# Patient Record
Sex: Female | Born: 1970 | Race: White | Hispanic: No | Marital: Married | Smoking: Former smoker
Health system: Southern US, Community
[De-identification: ages and names within clinical notes are randomized; demographics above are authoritative.]

## PROBLEM LIST (undated history)

## (undated) DIAGNOSIS — K219 Gastro-esophageal reflux disease without esophagitis: Secondary | ICD-10-CM

## (undated) DIAGNOSIS — Z9889 Other specified postprocedural states: Secondary | ICD-10-CM

## (undated) DIAGNOSIS — R112 Nausea with vomiting, unspecified: Secondary | ICD-10-CM

## (undated) DIAGNOSIS — E039 Hypothyroidism, unspecified: Secondary | ICD-10-CM

## (undated) HISTORY — PX: OTHER SURGICAL HISTORY: SHX169

## (undated) HISTORY — PX: LAPAROTOMY: SHX154

## (undated) HISTORY — PX: BREAST LUMPECTOMY: SHX2

## (undated) HISTORY — PX: TONSILLECTOMY: SUR1361

---

## 2000-04-18 ENCOUNTER — Encounter: Payer: Self-pay | Admitting: Emergency Medicine

## 2000-04-18 ENCOUNTER — Emergency Department (HOSPITAL_COMMUNITY): Admission: EM | Admit: 2000-04-18 | Discharge: 2000-04-18 | Payer: Self-pay | Admitting: *Deleted

## 2009-05-14 ENCOUNTER — Emergency Department (HOSPITAL_COMMUNITY): Admission: EM | Admit: 2009-05-14 | Discharge: 2009-05-14 | Payer: Self-pay | Admitting: Emergency Medicine

## 2011-07-21 ENCOUNTER — Other Ambulatory Visit (HOSPITAL_COMMUNITY)
Admission: RE | Admit: 2011-07-21 | Discharge: 2011-07-21 | Disposition: A | Discharge status: Home / Self Care | Payer: BC Managed Care – PPO | Source: Ambulatory Visit | Attending: Obstetrics and Gynecology | Admitting: Obstetrics and Gynecology

## 2011-07-21 DIAGNOSIS — Z124 Encounter for screening for malignant neoplasm of cervix: Secondary | ICD-10-CM | POA: Insufficient documentation

## 2011-07-21 DIAGNOSIS — Z1151 Encounter for screening for human papillomavirus (HPV): Secondary | ICD-10-CM | POA: Insufficient documentation

## 2015-10-17 ENCOUNTER — Other Ambulatory Visit: Payer: Self-pay | Admitting: Obstetrics and Gynecology

## 2015-10-17 DIAGNOSIS — R928 Other abnormal and inconclusive findings on diagnostic imaging of breast: Secondary | ICD-10-CM

## 2015-10-24 ENCOUNTER — Ambulatory Visit
Admission: RE | Admit: 2015-10-24 | Discharge: 2015-10-24 | Disposition: A | Discharge status: Home / Self Care | Payer: Managed Care, Other (non HMO) | Source: Ambulatory Visit | Attending: Obstetrics and Gynecology | Admitting: Obstetrics and Gynecology

## 2015-10-24 DIAGNOSIS — R928 Other abnormal and inconclusive findings on diagnostic imaging of breast: Secondary | ICD-10-CM

## 2016-10-29 ENCOUNTER — Other Ambulatory Visit (HOSPITAL_COMMUNITY): Payer: Self-pay | Admitting: Obstetrics & Gynecology

## 2016-10-29 DIAGNOSIS — E010 Iodine-deficiency related diffuse (endemic) goiter: Secondary | ICD-10-CM

## 2016-10-31 ENCOUNTER — Ambulatory Visit (HOSPITAL_COMMUNITY)
Admission: RE | Admit: 2016-10-31 | Discharge: 2016-10-31 | Disposition: A | Discharge status: Home / Self Care | Payer: BLUE CROSS/BLUE SHIELD | Source: Ambulatory Visit | Attending: Obstetrics & Gynecology | Admitting: Obstetrics & Gynecology

## 2016-10-31 ENCOUNTER — Ambulatory Visit (HOSPITAL_COMMUNITY): Payer: BLUE CROSS/BLUE SHIELD

## 2016-10-31 DIAGNOSIS — E010 Iodine-deficiency related diffuse (endemic) goiter: Secondary | ICD-10-CM | POA: Diagnosis not present

## 2016-11-06 ENCOUNTER — Other Ambulatory Visit: Payer: Self-pay | Admitting: Endocrinology

## 2016-11-06 DIAGNOSIS — E049 Nontoxic goiter, unspecified: Secondary | ICD-10-CM

## 2016-11-07 ENCOUNTER — Other Ambulatory Visit: Payer: Self-pay | Admitting: Endocrinology

## 2016-11-07 DIAGNOSIS — E049 Nontoxic goiter, unspecified: Secondary | ICD-10-CM

## 2016-11-11 ENCOUNTER — Ambulatory Visit
Admission: RE | Admit: 2016-11-11 | Discharge: 2016-11-11 | Disposition: A | Discharge status: Home / Self Care | Payer: BLUE CROSS/BLUE SHIELD | Source: Ambulatory Visit | Attending: Endocrinology | Admitting: Endocrinology

## 2016-11-11 ENCOUNTER — Other Ambulatory Visit (HOSPITAL_COMMUNITY)
Admission: RE | Admit: 2016-11-11 | Discharge: 2016-11-11 | Disposition: A | Discharge status: Home / Self Care | Payer: BLUE CROSS/BLUE SHIELD | Source: Ambulatory Visit | Attending: Radiology | Admitting: Radiology

## 2016-11-11 DIAGNOSIS — E049 Nontoxic goiter, unspecified: Secondary | ICD-10-CM

## 2016-11-24 ENCOUNTER — Encounter (HOSPITAL_COMMUNITY): Payer: Self-pay

## 2017-01-08 NOTE — Pre-Procedure Instructions (Signed)
Sydney Stephens  01/08/2017      [id number] - [place], [place] - [address] [place] [address] Phone: [phone] Fax: [phone]    Your procedure is scheduled on January 16, 2017.  Report to [facility] Admitting at [phone] A.M.  Call this number if you have problems the morning of surgery:[phone]   Remember:  Do not eat food or drink liquids after midnight.  Take these medicines the morning of surgery with A SIP OF WATER levothyroxine (synthroid), loratadine (claritin), ranitidine (zantac), tetrahydrozoline-zinc (Visine-AC).  7 days prior to surgery STOP taking any Aspirin, Aleve, Naproxen, Ibuprofen, Motrin, Advil, Goody's, BC's, all herbal medications, fish oil, and all vitamins   Do not wear jewelry, make-up or nail polish.  Do not wear lotions, powders, or perfumes, or deoderant.  Do not shave 48 hours prior to surgery.    Do not bring valuables to the hospital.  [facility] is not responsible for any belongings or valuables.  Contacts, dentures or bridgework may not be worn into surgery.  Leave your suitcase in the car.  After surgery it may be brought to your room.  For patients admitted to the hospital, discharge time will be determined by your treatment team.  Patients discharged the day of surgery will not be allowed to drive home.    Special instructions:   [facility]- Preparing For Surgery  Before surgery, you can play an important role. Because skin is not sterile, your skin needs to be as free of germs as possible. You can reduce the number of germs on your skin by washing with CHG (chlorahexidine gluconate) Soap before surgery.  CHG is an antiseptic cleaner which kills germs and bonds with the skin to continue killing germs even after washing.  Please do not use if you have an allergy to CHG or antibacterial soaps. If your skin becomes reddened/irritated stop using the CHG.  Do not shave (including legs and  underarms) for at least 48 hours prior to first CHG shower. It is OK to shave your face.  Please follow these instructions carefully.   1. Shower the NIGHT BEFORE SURGERY and the MORNING OF SURGERY with CHG.   2. If you chose to wash your hair, wash your hair first as usual with your normal shampoo.  3. After you shampoo, rinse your hair and body thoroughly to remove the shampoo.  4. Use CHG as you would any other liquid soap. You can apply CHG directly to the skin and wash gently with a scrungie or a clean washcloth.   5. Apply the CHG Soap to your body ONLY FROM THE NECK DOWN.  Do not use on open wounds or open sores. Avoid contact with your eyes, ears, mouth and genitals (private parts). Wash genitals (private parts) with your normal soap.  6. Wash thoroughly, paying special attention to the area where your surgery will be performed.  7. Thoroughly rinse your body with warm water from the neck down.  8. DO NOT shower/wash with your normal soap after using and rinsing off the CHG Soap.  9. Pat yourself dry with a CLEAN TOWEL.   10. Wear CLEAN PAJAMAS   11. Place CLEAN SHEETS on your bed the night of your first shower and DO NOT SLEEP WITH PETS.    Day of Surgery: Do not apply any deodorants/lotions. Please wear clean clothes to the hospital/surgery center.     Please read over the following fact sheets  that you were given. Pain Booklet, Coughing and Deep Breathing and Surgical Site Infection Prevention

## 2017-01-08 NOTE — H&P (Signed)
HPI:   Sydney Stephens is a 46 y.o. female who presents as a consult Patient.   Referring Provider: Valli Glance*  Chief complaint: Thyroid nodules.  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 She is also hypothyroid. She is on replacement therapy. She had an ultrasound and biopsy recently. Molecular testing revealed a suspicious nodule, one on each side. The largest one was just under 2 cm. She has had some trouble swallowing especially pills. She has long-standing reflux and takes H2 blocker therapy. She quit smoking years ago. She drinks 2 cups of coffee each day.  PMH/Meds/All/SocHx/FamHx/ROS:   Past Medical History:  Diagnosis Date  . Hypothyroid   Past Surgical History:  Procedure Laterality Date  . BREAST SURGERY  . LAPAROTOMY  . MOUTH SURGERY  . TONSILLECTOMY   No family history of bleeding disorders, wound healing problems or difficulty with anesthesia.   Social History   Social History  . Marital status: Married  Spouse name: N/A  . Number of children: N/A  . Years of education: N/A   Occupational History  . Not on file.   Social History Main Topics  . Smoking status: Never Smoker  . Smokeless tobacco: Never Used  . Alcohol use Not on file  . Drug use: Unknown  . Sexual activity: Not on file   Other Topics Concern  . Not on file   Social History Narrative  . No narrative on file   Current Outpatient Prescriptions:  . aspirin 81 MG chewable tablet *ANTIPLATELET*, Take 81 mg by mouth daily., Disp: , Rfl:  . diphenhydramine HCl (BENADRYL ORAL), Take by mouth., Disp: , Rfl:  . famotidine (PEPCID ORAL), Take by mouth., Disp: , Rfl:  . levothyroxine (SYNTHROID, LEVOTHROID) 75 MCG tablet, , Disp: , Rfl:  . loratadine (CLARITIN ORAL), Take by mouth., Disp: , Rfl:   A complete ROS was performed with pertinent positives/negatives noted in the HPI. The remainder of the ROS are negative.   Physical Exam:   BP 111/75 (Site: Left arm,  Position: Standing)  Ht 1.778 m (5\' 10" )  Wt 83.5 kg (184 lb)  BMI 26.40 kg/m   General: Healthy and alert, in no distress, breathing easily. Normal affect. In a pleasant mood. Head: Normocephalic, atraumatic. No masses, or scars. Eyes: Pupils are equal, and reactive to light. Vision is grossly intact. No spontaneous or gaze nystagmus. Ears: Ear canals are clear. Tympanic membranes are intact, with normal landmarks and the middle ears are clear and healthy. Hearing: Grossly normal. Nose: Nasal cavities are clear with healthy mucosa, no polyps or exudate.Airways are patent. Face: No masses or scars, facial nerve function is symmetric. Oral Cavity: No mucosal abnormalities are noted. Tongue with normal mobility. Dentition appears healthy. Oropharynx: Tonsils are symmetric. There are no mucosal masses identified. Tongue base appears normal and healthy. Larynx/Hypopharynx: indirect exam reveals healthy, mobile vocal cords, without mucosal lesions in the hypopharynx or larynx. Chest: Deferred Neck: No palpable masses, no cervical adenopathy, bilateral thyroid enlargement. Neuro: Cranial nerves II-XII will normal function. Balance: Normal gate. Other findings: none.  Independent Review of Additional Tests or Records:  none  Procedures:  none  Impression & Plans:  Bilateral thyroid nodules, suspicious cytology on molecular testing. We discussed there is approximately 40% chance of thyroid cancer. Recommend total thyroidectomy for diagnostic and possibly therapeutic purposes. Trouble swallowing is likely not related to the thyroid and probably from reflux.We discussed causes of reflux, including lifestyle and dietary factors. Recommend strict avoidance of all tobacco,  caffeine, alcohol, chocolate and peppermint. A reflux handout with more detailed instructions was provided to the patient.  Risks and benefits of thyroid surgery were discussed in detail. A handout was provided. All questions were  answered. We will schedule at her convenience.

## 2017-01-09 ENCOUNTER — Encounter (HOSPITAL_COMMUNITY): Payer: Self-pay | Admitting: *Deleted

## 2017-01-09 ENCOUNTER — Encounter (HOSPITAL_COMMUNITY)
Admission: RE | Admit: 2017-01-09 | Discharge: 2017-01-09 | Disposition: A | Discharge status: Home / Self Care | Payer: BLUE CROSS/BLUE SHIELD | Source: Ambulatory Visit | Attending: Otolaryngology | Admitting: Otolaryngology

## 2017-01-09 ENCOUNTER — Ambulatory Visit (HOSPITAL_COMMUNITY)
Admission: RE | Admit: 2017-01-09 | Discharge: 2017-01-09 | Disposition: A | Discharge status: Home / Self Care | Payer: BLUE CROSS/BLUE SHIELD | Source: Ambulatory Visit | Attending: Anesthesiology | Admitting: Anesthesiology

## 2017-01-09 DIAGNOSIS — Z01810 Encounter for preprocedural cardiovascular examination: Secondary | ICD-10-CM | POA: Insufficient documentation

## 2017-01-09 DIAGNOSIS — Z01818 Encounter for other preprocedural examination: Secondary | ICD-10-CM | POA: Diagnosis present

## 2017-01-09 DIAGNOSIS — Z01811 Encounter for preprocedural respiratory examination: Secondary | ICD-10-CM

## 2017-01-09 HISTORY — DX: Gastro-esophageal reflux disease without esophagitis: K21.9

## 2017-01-09 HISTORY — DX: Hypothyroidism, unspecified: E03.9

## 2017-01-09 HISTORY — DX: Nausea with vomiting, unspecified: R11.2

## 2017-01-09 HISTORY — DX: Other specified postprocedural states: Z98.890

## 2017-01-09 LAB — BASIC METABOLIC PANEL
Anion gap: 8 (ref 5–15)
BUN: 24 mg/dL — AB (ref 6–20)
CALCIUM: 9.6 mg/dL (ref 8.9–10.3)
CO2: 26 mmol/L (ref 22–32)
Chloride: 107 mmol/L (ref 101–111)
Creatinine, Ser: 0.98 mg/dL (ref 0.44–1.00)
GFR calc Af Amer: >60 mL/min (ref >60)
Glucose, Bld: 98 mg/dL (ref 65–99)
Potassium: 5.1 mmol/L (ref 3.5–5.1)
Sodium: 141 mmol/L (ref 135–145)

## 2017-01-09 LAB — CBC
HEMATOCRIT: 40.7 % (ref 36.0–46.0)
HEMOGLOBIN: 13 g/dL (ref 12.0–15.0)
MCH: 30.7 pg (ref 26.0–34.0)
MCHC: 31.9 g/dL (ref 30.0–36.0)
MCV: 96.2 fL (ref 78.0–100.0)
Platelets: 300 10*3/uL (ref 150–400)
RBC: 4.23 MIL/uL (ref 3.87–5.11)
RDW: 13.2 % (ref 11.5–15.5)
WBC: 7.3 10*3/uL (ref 4.0–10.5)

## 2017-01-09 NOTE — Progress Notes (Signed)
PCP is Dr Linda Hedges  Denies ever seeing a cardiologist.  Denies ever having a stress test, echo, or card cath. Denies ever having an EKG. Denies  fever, cough, chest pain, or shortness of breath.

## 2017-01-16 ENCOUNTER — Observation Stay (HOSPITAL_COMMUNITY)
Admission: RE | Admit: 2017-01-16 | Discharge: 2017-01-17 | Disposition: A | Discharge status: Home / Self Care | Payer: BLUE CROSS/BLUE SHIELD | Source: Ambulatory Visit | Attending: Otolaryngology | Admitting: Otolaryngology

## 2017-01-16 ENCOUNTER — Encounter (HOSPITAL_COMMUNITY)
Admission: RE | Disposition: A | Discharge status: Home / Self Care | Payer: Self-pay | Source: Ambulatory Visit | Attending: Otolaryngology

## 2017-01-16 ENCOUNTER — Ambulatory Visit (HOSPITAL_COMMUNITY): Payer: BLUE CROSS/BLUE SHIELD | Admitting: Anesthesiology

## 2017-01-16 ENCOUNTER — Encounter (HOSPITAL_COMMUNITY): Payer: Self-pay | Admitting: Urology

## 2017-01-16 DIAGNOSIS — E063 Autoimmune thyroiditis: Secondary | ICD-10-CM | POA: Insufficient documentation

## 2017-01-16 DIAGNOSIS — Z7982 Long term (current) use of aspirin: Secondary | ICD-10-CM | POA: Diagnosis not present

## 2017-01-16 DIAGNOSIS — Z87891 Personal history of nicotine dependence: Secondary | ICD-10-CM | POA: Insufficient documentation

## 2017-01-16 DIAGNOSIS — E039 Hypothyroidism, unspecified: Secondary | ICD-10-CM | POA: Insufficient documentation

## 2017-01-16 DIAGNOSIS — E042 Nontoxic multinodular goiter: Secondary | ICD-10-CM | POA: Diagnosis present

## 2017-01-16 DIAGNOSIS — D34 Benign neoplasm of thyroid gland: Principal | ICD-10-CM | POA: Insufficient documentation

## 2017-01-16 DIAGNOSIS — E890 Postprocedural hypothyroidism: Secondary | ICD-10-CM

## 2017-01-16 DIAGNOSIS — K219 Gastro-esophageal reflux disease without esophagitis: Secondary | ICD-10-CM | POA: Insufficient documentation

## 2017-01-16 DIAGNOSIS — Z79899 Other long term (current) drug therapy: Secondary | ICD-10-CM | POA: Diagnosis not present

## 2017-01-16 DIAGNOSIS — Z9889 Other specified postprocedural states: Secondary | ICD-10-CM

## 2017-01-16 HISTORY — PX: THYROIDECTOMY: SHX17

## 2017-01-16 LAB — CALCIUM: Calcium: 8.7 mg/dL — ABNORMAL LOW (ref 8.9–10.3)

## 2017-01-16 LAB — PREGNANCY, URINE: PREG TEST UR: NEGATIVE

## 2017-01-16 SURGERY — THYROIDECTOMY
Anesthesia: General

## 2017-01-16 MED ORDER — 0.9 % SODIUM CHLORIDE (POUR BTL) OPTIME
TOPICAL | Status: DC | PRN
  Administered 2017-01-16: 1000 mL

## 2017-01-16 MED ORDER — OXYCODONE HCL 5 MG PO TABS: 5.0000 mg | ORAL_TABLET | Freq: Once | ORAL | Status: DC | PRN

## 2017-01-16 MED ORDER — MIDAZOLAM HCL 2 MG/2ML IJ SOLN
INTRAMUSCULAR | Status: AC
  Filled 2017-01-16: qty 2

## 2017-01-16 MED ORDER — IBUPROFEN 100 MG/5ML PO SUSP
400.0000 mg | Freq: Four times a day (QID) | ORAL | Status: DC | PRN
  Filled 2017-01-16: qty 20

## 2017-01-16 MED ORDER — DEXAMETHASONE SODIUM PHOSPHATE 10 MG/ML IJ SOLN
INTRAMUSCULAR | Status: DC | PRN
  Administered 2017-01-16: 10 mg via INTRAVENOUS

## 2017-01-16 MED ORDER — PROPOFOL 10 MG/ML IV BOLUS
INTRAVENOUS | Status: AC
  Filled 2017-01-16: qty 40

## 2017-01-16 MED ORDER — DEXAMETHASONE SODIUM PHOSPHATE 10 MG/ML IJ SOLN
INTRAMUSCULAR | Status: AC
  Filled 2017-01-16: qty 3

## 2017-01-16 MED ORDER — SUCCINYLCHOLINE CHLORIDE 200 MG/10ML IV SOSY
PREFILLED_SYRINGE | INTRAVENOUS | Status: AC
  Filled 2017-01-16: qty 10

## 2017-01-16 MED ORDER — DEXTROSE-NACL 5-0.9 % IV SOLN
INTRAVENOUS | Status: DC
  Administered 2017-01-16 – 2017-01-17 (×2): via INTRAVENOUS

## 2017-01-16 MED ORDER — DIPHENHYDRAMINE HCL 25 MG PO TABS
25.0000 mg | ORAL_TABLET | Freq: Every day | ORAL | Status: DC
  Filled 2017-01-16: qty 1

## 2017-01-16 MED ORDER — SUGAMMADEX SODIUM 200 MG/2ML IV SOLN
INTRAVENOUS | Status: AC
  Filled 2017-01-16: qty 2

## 2017-01-16 MED ORDER — FAMOTIDINE 10 MG PO TABS
10.0000 mg | ORAL_TABLET | Freq: Two times a day (BID) | ORAL | Status: DC
  Administered 2017-01-16 – 2017-01-17 (×2): 10 mg via ORAL
  Filled 2017-01-16 (×2): qty 1

## 2017-01-16 MED ORDER — NAPHAZOLINE-GLYCERIN 0.012-0.2 % OP SOLN
1.0000 [drp] | Freq: Every day | OPHTHALMIC | Status: DC
  Administered 2017-01-17: 1 [drp] via OPHTHALMIC
  Filled 2017-01-16: qty 15

## 2017-01-16 MED ORDER — FENTANYL CITRATE (PF) 250 MCG/5ML IJ SOLN
INTRAMUSCULAR | Status: AC
  Filled 2017-01-16: qty 5

## 2017-01-16 MED ORDER — MAGNESIUM OXIDE -MG SUPPLEMENT 500 MG PO CAPS: 1000.0000 mg | ORAL_CAPSULE | Freq: Every day | ORAL | Status: DC

## 2017-01-16 MED ORDER — HYDROCODONE-ACETAMINOPHEN 7.5-325 MG PO TABS: 1.0000 | ORAL_TABLET | Freq: Four times a day (QID) | ORAL | 0 refills | Status: DC | PRN

## 2017-01-16 MED ORDER — PHENYLEPHRINE 40 MCG/ML (10ML) SYRINGE FOR IV PUSH (FOR BLOOD PRESSURE SUPPORT)
PREFILLED_SYRINGE | INTRAVENOUS | Status: AC
  Filled 2017-01-16: qty 10

## 2017-01-16 MED ORDER — LIDOCAINE 2% (20 MG/ML) 5 ML SYRINGE
INTRAMUSCULAR | Status: AC
  Filled 2017-01-16: qty 15

## 2017-01-16 MED ORDER — PROMETHAZINE HCL 25 MG RE SUPP: 25.0000 mg | Freq: Four times a day (QID) | RECTAL | 1 refills | Status: DC | PRN

## 2017-01-16 MED ORDER — SUGAMMADEX SODIUM 200 MG/2ML IV SOLN
INTRAVENOUS | Status: DC | PRN
  Administered 2017-01-16: 200 mg via INTRAVENOUS

## 2017-01-16 MED ORDER — HYDROMORPHONE HCL 1 MG/ML IJ SOLN
0.2500 mg | INTRAMUSCULAR | Status: DC | PRN
  Administered 2017-01-16: 0.25 mg via INTRAVENOUS
  Administered 2017-01-16: 0.5 mg via INTRAVENOUS
  Administered 2017-01-16: 0.25 mg via INTRAVENOUS

## 2017-01-16 MED ORDER — PROMETHAZINE HCL 25 MG/ML IJ SOLN: 6.2500 mg | INTRAMUSCULAR | Status: DC | PRN

## 2017-01-16 MED ORDER — ROCURONIUM BROMIDE 10 MG/ML (PF) SYRINGE
PREFILLED_SYRINGE | INTRAVENOUS | Status: AC
  Filled 2017-01-16: qty 10

## 2017-01-16 MED ORDER — PROMETHAZINE HCL 25 MG RE SUPP
25.0000 mg | Freq: Four times a day (QID) | RECTAL | Status: DC | PRN
Start: 2017-01-16 — End: 2017-01-17
  Administered 2017-01-16 – 2017-01-17 (×2): 25 mg via RECTAL
  Filled 2017-01-16 (×2): qty 1

## 2017-01-16 MED ORDER — LEVOTHYROXINE SODIUM 75 MCG PO TABS
75.0000 ug | ORAL_TABLET | Freq: Every day | ORAL | Status: DC
  Administered 2017-01-17: 75 ug via ORAL
  Filled 2017-01-16: qty 1

## 2017-01-16 MED ORDER — MIDAZOLAM HCL 5 MG/5ML IJ SOLN
INTRAMUSCULAR | Status: DC | PRN
  Administered 2017-01-16: 2 mg via INTRAVENOUS

## 2017-01-16 MED ORDER — ROCURONIUM BROMIDE 100 MG/10ML IV SOLN
INTRAVENOUS | Status: DC | PRN
  Administered 2017-01-16: 40 mg via INTRAVENOUS

## 2017-01-16 MED ORDER — ONDANSETRON HCL 4 MG/2ML IJ SOLN
INTRAMUSCULAR | Status: DC | PRN
  Administered 2017-01-16: 4 mg via INTRAVENOUS

## 2017-01-16 MED ORDER — CALCIUM CARBONATE-VITAMIN D 500-200 MG-UNIT PO TABS
2.0000 | ORAL_TABLET | Freq: Every day | ORAL | Status: DC
  Administered 2017-01-17: 09:00:00 2 via ORAL
  Filled 2017-01-16: qty 2

## 2017-01-16 MED ORDER — ONDANSETRON HCL 4 MG/2ML IJ SOLN
INTRAMUSCULAR | Status: AC
  Filled 2017-01-16: qty 6

## 2017-01-16 MED ORDER — HYDROMORPHONE HCL 1 MG/ML IJ SOLN
INTRAMUSCULAR | Status: AC
  Filled 2017-01-16: qty 0.5

## 2017-01-16 MED ORDER — VANCOMYCIN HCL 1000 MG IV SOLR: INTRAVENOUS | Status: DC | PRN

## 2017-01-16 MED ORDER — LORATADINE 10 MG PO TABS
10.0000 mg | ORAL_TABLET | Freq: Every day | ORAL | Status: DC
  Administered 2017-01-17: 10 mg via ORAL
  Filled 2017-01-16: qty 1

## 2017-01-16 MED ORDER — PROPOFOL 10 MG/ML IV BOLUS
INTRAVENOUS | Status: DC | PRN
  Administered 2017-01-16: 200 mg via INTRAVENOUS

## 2017-01-16 MED ORDER — FENTANYL CITRATE (PF) 100 MCG/2ML IJ SOLN
INTRAMUSCULAR | Status: DC | PRN
Start: 2017-01-16 — End: 2017-01-16
  Administered 2017-01-16 (×3): 50 ug via INTRAVENOUS
  Administered 2017-01-16: 150 ug via INTRAVENOUS
  Administered 2017-01-16: 50 ug via INTRAVENOUS

## 2017-01-16 MED ORDER — FENTANYL CITRATE (PF) 250 MCG/5ML IJ SOLN
INTRAMUSCULAR | Status: AC
Start: 2017-01-16 — End: 2017-01-16
  Filled 2017-01-16: qty 5

## 2017-01-16 MED ORDER — LIDOCAINE HCL (CARDIAC) 20 MG/ML IV SOLN
INTRAVENOUS | Status: DC | PRN
  Administered 2017-01-16: 60 mg via INTRAVENOUS

## 2017-01-16 MED ORDER — OXYCODONE HCL 5 MG/5ML PO SOLN: 5.0000 mg | Freq: Once | ORAL | Status: DC | PRN

## 2017-01-16 MED ORDER — HYDROCODONE-ACETAMINOPHEN 5-325 MG PO TABS
1.0000 | ORAL_TABLET | ORAL | Status: DC | PRN
  Administered 2017-01-16 – 2017-01-17 (×4): 2 via ORAL
  Filled 2017-01-16 (×4): qty 2

## 2017-01-16 MED ORDER — LIDOCAINE-EPINEPHRINE 1 %-1:100000 IJ SOLN
INTRAMUSCULAR | Status: AC
  Filled 2017-01-16: qty 1

## 2017-01-16 MED ORDER — HYDROMORPHONE HCL 1 MG/ML IJ SOLN
INTRAMUSCULAR | Status: AC
  Administered 2017-01-16: 0.25 mg via INTRAVENOUS
  Filled 2017-01-16: qty 0.5

## 2017-01-16 MED ORDER — DIPHENHYDRAMINE HCL 25 MG PO CAPS
25.0000 mg | ORAL_CAPSULE | Freq: Every day | ORAL | Status: DC
  Administered 2017-01-16: 25 mg via ORAL
  Filled 2017-01-16: qty 1

## 2017-01-16 MED ORDER — LACTATED RINGERS IV SOLN
INTRAVENOUS | Status: DC | PRN
  Administered 2017-01-16 (×2): via INTRAVENOUS

## 2017-01-16 SURGICAL SUPPLY — 43 items
BLADE SURG 15 STRL LF DISP TIS (BLADE) IMPLANT
BLADE SURG 15 STRL SS (BLADE)
CANISTER SUCT 3000ML PPV (MISCELLANEOUS) ×3 IMPLANT
CLEANER TIP ELECTROSURG 2X2 (MISCELLANEOUS) ×3 IMPLANT
CONT SPEC 4OZ CLIKSEAL STRL BL (MISCELLANEOUS) ×6 IMPLANT
CORDS BIPOLAR (ELECTRODE) ×6 IMPLANT
COVER SURGICAL LIGHT HANDLE (MISCELLANEOUS) ×3 IMPLANT
DERMABOND ADVANCED (GAUZE/BANDAGES/DRESSINGS) ×2
DERMABOND ADVANCED .7 DNX12 (GAUZE/BANDAGES/DRESSINGS) ×1 IMPLANT
DRAIN HEMOVAC 7FR (DRAIN) IMPLANT
DRAIN SNY 10 ROU (WOUND CARE) ×3 IMPLANT
DRAPE HALF SHEET 40X57 (DRAPES) IMPLANT
ELECT COATED BLADE 2.86 ST (ELECTRODE) ×3 IMPLANT
ELECT REM PT RETURN 9FT ADLT (ELECTROSURGICAL) ×3
ELECTRODE REM PT RTRN 9FT ADLT (ELECTROSURGICAL) ×1 IMPLANT
EVACUATOR SILICONE 100CC (DRAIN) ×3 IMPLANT
FORCEPS BIPOLAR SPETZLER 8 1.0 (NEUROSURGERY SUPPLIES) ×3 IMPLANT
GAUZE SPONGE 4X4 16PLY XRAY LF (GAUZE/BANDAGES/DRESSINGS) ×9 IMPLANT
GLOVE BIO SURGEON STRL SZ 6.5 (GLOVE) IMPLANT
GLOVE BIO SURGEONS STRL SZ 6.5 (GLOVE)
GLOVE BIOGEL PI IND STRL 6.5 (GLOVE) ×2 IMPLANT
GLOVE BIOGEL PI IND STRL 7.0 (GLOVE) ×2 IMPLANT
GLOVE BIOGEL PI INDICATOR 6.5 (GLOVE) ×4
GLOVE BIOGEL PI INDICATOR 7.0 (GLOVE) ×4
GLOVE ECLIPSE 6.5 STRL STRAW (GLOVE) ×9 IMPLANT
GLOVE ECLIPSE 7.5 STRL STRAW (GLOVE) ×3 IMPLANT
GOWN STRL REUS W/ TWL LRG LVL3 (GOWN DISPOSABLE) ×4 IMPLANT
GOWN STRL REUS W/TWL LRG LVL3 (GOWN DISPOSABLE) ×8
KIT BASIN OR (CUSTOM PROCEDURE TRAY) ×3 IMPLANT
KIT ROOM TURNOVER OR (KITS) ×3 IMPLANT
NEEDLE PRECISIONGLIDE 27X1.5 (NEEDLE) ×3 IMPLANT
NS IRRIG 1000ML POUR BTL (IV SOLUTION) ×3 IMPLANT
PAD ARMBOARD 7.5X6 YLW CONV (MISCELLANEOUS) ×6 IMPLANT
PENCIL FOOT CONTROL (ELECTRODE) ×3 IMPLANT
SHEARS HARMONIC 9CM CVD (BLADE) ×3 IMPLANT
STAPLER VISISTAT 35W (STAPLE) ×3 IMPLANT
SUT CHROMIC 3 0 PS 2 (SUTURE) ×3 IMPLANT
SUT CHROMIC 4 0 PS 2 18 (SUTURE) ×3 IMPLANT
SUT ETHILON 3 0 PS 1 (SUTURE) ×6 IMPLANT
SUT SILK 3 0 REEL (SUTURE) IMPLANT
SUT SILK 4 0 REEL (SUTURE) ×3 IMPLANT
TOWEL OR 17X24 6PK STRL BLUE (TOWEL DISPOSABLE) ×3 IMPLANT
TRAY ENT MC OR (CUSTOM PROCEDURE TRAY) ×3 IMPLANT

## 2017-01-16 NOTE — Transfer of Care (Signed)
Immediate Anesthesia Transfer of Care Note  Patient: Sydney Stephens  Procedure(s) Performed: Procedure(s) with comments: THYROIDECTOMY (N/A) - RNFA (LINDA)  Patient Location: PACU  Anesthesia Type:General  Level of Consciousness: awake, alert  and oriented  Airway & Oxygen Therapy: Patient Spontanous Breathing and Patient connected to nasal cannula oxygen  Post-op Assessment: Report given to RN, Post -op Vital signs reviewed and stable and Patient moving all extremities X 4  Post vital signs: Reviewed and stable  Last Vitals:  Vitals:   01/16/17 1041 01/16/17 1425  BP: 111/63   Pulse: 76   Resp: 20   Temp: 37 C (P) 36.7 C    Last Pain:  Vitals:   01/16/17 1041  TempSrc: Oral      Patients Stated Pain Goal: (P) 0 (28/20/81 3887)  Complications: No apparent anesthesia complications

## 2017-01-16 NOTE — Discharge Instructions (Signed)
You may shower and use soap and water. Do not use any creams, oils or ointment. ° °

## 2017-01-16 NOTE — Anesthesia Preprocedure Evaluation (Addendum)
Anesthesia Evaluation  Patient identified by MRN, date of birth, ID band Patient awake    Reviewed: Allergy & Precautions, NPO status , Patient's Chart, lab work & pertinent test results  History of Anesthesia Complications (+) PONV  Airway Mallampati: I  TM Distance: >3 FB Neck ROM: Full    Dental no notable dental hx.    Pulmonary former smoker,    Pulmonary exam normal breath sounds clear to auscultation       Cardiovascular Exercise Tolerance: Good negative cardio ROS Normal cardiovascular exam Rhythm:Regular Rate:Normal  ECG: NSR, rate 63   Neuro/Psych negative neurological ROS  negative psych ROS   GI/Hepatic Neg liver ROS, GERD  Medicated and Controlled,  Endo/Other  Hypothyroidism   Renal/GU negative Renal ROS  negative genitourinary   Musculoskeletal negative musculoskeletal ROS (+)   Abdominal   Peds negative pediatric ROS (+)  Hematology negative hematology ROS (+)   Anesthesia Other Findings   Reproductive/Obstetrics negative OB ROS                            Anesthesia Physical Anesthesia Plan  ASA: II  Anesthesia Plan: General   Post-op Pain Management:    Induction: Intravenous  PONV Risk Score and Plan: 4 or greater and Ondansetron, Dexamethasone, Propofol and Midazolam  Airway Management Planned: Oral ETT  Additional Equipment:   Intra-op Plan:   Post-operative Plan: Extubation in OR  Informed Consent: I have reviewed the patients History and Physical, chart, labs and discussed the procedure including the risks, benefits and alternatives for the proposed anesthesia with the patient or authorized representative who has indicated his/her understanding and acceptance.   Dental advisory given  Plan Discussed with: CRNA  Anesthesia Plan Comments:         Anesthesia Quick Evaluation

## 2017-01-16 NOTE — Op Note (Signed)
OPERATIVE REPORT  DATE OF SURGERY: 01/16/2017  PATIENT:  Sydney Stephens,  46 y.o. female  PRE-OPERATIVE DIAGNOSIS:  Thyroid Nodule  POST-OPERATIVE DIAGNOSIS:  Thyroid Nodule  PROCEDURE:  Procedure(s): THYROIDECTOMY  SURGEON:  Beckie Salts, MD  ASSISTANTS: Jeralene Peters, RNFA  ANESTHESIA:   General   EBL:  100 ml  DRAINS: 10 French round JP  LOCAL MEDICATIONS USED:  None  SPECIMEN:  Total thyroidectomy, pretracheal lymph nodes  COUNTS:  Correct  PROCEDURE DETAILS: The patient was taken to the operating room and placed on the operating table in the supine position. A shoulder roll was placed beneath the shoulder blades and the neck was extended. The neck was prepped and draped in a standard fashion. A low collar transverse incision was outlined with a marking pen and was incised with electrocautery . Dissection was continued down through the platysma layer.  Self-retaining retractors were used throughout the case.  The midline fascia was divided. The straps were dissected off of the thyroid on both sides. There was some adhesion of the thyroid to the surrounding structures diffusely. This was consistent with thyroiditis. Both sides were enlarged with multiple nodules.  The left side was dissected first. Call to use Allis clamps as the thyroid tissue was diffusely friable and very difficult to grasp. Superior vasculature was identified first and ligated between clamps and divided. Silk ties and Visual merchandiser was used. The middle thyroid vein was ligated and divided. Inferior vasculature was ligated and divided as well. The recurrent nerve was identified and preserved all the way up towards the entry into the larynx. A putative inferior parathyroid was identified and preserved with its blood supply. Berry's ligament was divided as the gland was dissected off the trachea. Again the gland was sticky throughout the entire attachment to the trachea.  The same dissection was  accomplished on the right. The recurrent nerve was also identified and the suspected superior parathyroid was identified and preserved. There were similar findings. The entire gland was removed and a suture was used to mark the right lobe. There are 2 small lymph nodes identified just below the gland and the pretracheal fat. These were removed and sent with the specimen and labeled pretracheal nodes, marked with a suture.  The wound was irrigated with saline. All bleeding was treated with ties and bipolar cautery as needed. The drain was exited through the central skin inferiorly using a separate stab incision.  The drain was secured in place with a nylon suture. The midline fascia was reapproximated with interrupted chromic suture. The platysma layer was also reapproximated with interrupted chromic suture. A running subcuticular closure was accomplished. Dermabond was used on the skin. The drain was charged. The patient was awakened, extubated and transferred to recovery in stable condition.   PATIENT DISPOSITION:  To PACU, stable

## 2017-01-16 NOTE — Anesthesia Procedure Notes (Signed)
Procedure Name: Intubation Date/Time: 01/16/2017 12:44 PM Performed by: Neldon Newport Pre-anesthesia Checklist: Timeout performed, Patient being monitored, Suction available, Emergency Drugs available and Patient identified Patient Re-evaluated:Patient Re-evaluated prior to inductionOxygen Delivery Method: Circle system utilized Preoxygenation: Pre-oxygenation with 100% oxygen Intubation Type: IV induction Ventilation: Mask ventilation without difficulty and Oral airway inserted - appropriate to patient size Laryngoscope Size: Mac and 3 Grade View: Grade II Tube type: Oral Tube size: 7.0 mm Number of attempts: 1 Placement Confirmation: breath sounds checked- equal and bilateral,  positive ETCO2 and ETT inserted through vocal cords under direct vision Secured at: 22 cm Tube secured with: Tape Dental Injury: Teeth and Oropharynx as per pre-operative assessment

## 2017-01-16 NOTE — Interval H&P Note (Signed)
History and Physical Interval Note:  01/16/2017 12:28 PM  Sydney Stephens  has presented today for surgery, with the diagnosis of Thyroid Nodule  The various methods of treatment have been discussed with the patient and family. After consideration of risks, benefits and other options for treatment, the patient has consented to  Procedure(s) with comments: THYROIDECTOMY (N/A) - RNFA (LINDA) as a surgical intervention .  The patient's history has been reviewed, patient examined, no change in status, stable for surgery.  I have reviewed the patient's chart and labs.  Questions were answered to the patient's satisfaction.     Karell Tukes

## 2017-01-16 NOTE — Progress Notes (Signed)
Patient ID: Sydney Stephens, female   DOB: 13-Feb-1971, 46 y.o.   MRN: [id number] Sleepy and a little nauseated. Voice strong, neck excellent. JP functioning.  Continue overnight obs.

## 2017-01-17 DIAGNOSIS — D34 Benign neoplasm of thyroid gland: Secondary | ICD-10-CM | POA: Diagnosis not present

## 2017-01-17 LAB — CALCIUM
CALCIUM: 8 mg/dL — AB (ref 8.9–10.3)
CALCIUM: 8.1 mg/dL — AB (ref 8.9–10.3)

## 2017-01-17 NOTE — Discharge Summary (Signed)
  Physician Discharge Summary  Patient ID: Sydney Stephens MRN: [id number] DOB/AGE: August 28, 1970 46 y.o.  Admit date: 01/16/2017 Discharge date: 01/17/2017  Admission Diagnoses:thyroid mass  Discharge Diagnoses:  Active Problems:   S/P total thyroidectomy   Discharged Condition: good  Hospital Course: no complications  Consults: none  Significant Diagnostic Studies: none  Treatments: surgery: total thyroidectomy  Discharge Exam: Blood pressure 110/60, pulse 76, temperature 98.8 F (37.1 C), temperature source Oral, resp. rate 18, height 5\' 10"  (1.778 m), weight 83.9 kg (184 lb 15.5 oz), SpO2 100 %. PHYSICAL EXAM: Incision excellent. JP removed. Voice a little weak. Swallowing well.  Disposition: Final discharge disposition not confirmed  Discharge Instructions    Diet - low sodium heart healthy    Complete by:  As directed    Increase activity slowly    Complete by:  As directed      Allergies as of 01/17/2017      Reactions   No Known Allergies       Medication List    TAKE these medications   CALCIUM 600 + D PO Take 2 tablets by mouth daily.   diphenhydrAMINE 25 MG tablet Commonly known as:  BENADRYL Take 25 mg by mouth at bedtime.   HYDROcodone-acetaminophen 7.5-325 MG tablet Commonly known as:  NORCO Take 1 tablet by mouth every 6 (six) hours as needed for moderate pain.   levothyroxine 75 MCG tablet Commonly known as:  SYNTHROID, LEVOTHROID Take 75 mcg by mouth daily.   loratadine 10 MG tablet Commonly known as:  CLARITIN Take 10 mg by mouth daily.   Magnesium Oxide 500 MG Caps Take 1,000 mg by mouth daily.   promethazine 25 MG suppository Commonly known as:  PHENERGAN Place 1 suppository (25 mg total) rectally every 6 (six) hours as needed for nausea or vomiting.   ranitidine 150 MG capsule Commonly known as:  ZANTAC Take 150 mg by mouth 2 (two) times daily.   tetrahydrozoline-zinc 0.05-0.25 % ophthalmic solution Commonly known as:   VISINE-AC Place 1 drop into both eyes daily.      Follow-up Information    Izora Gala, MD. Schedule an appointment as soon as possible for a visit in 1 week.   Specialty:  Otolaryngology Contact information: [address] [place] [address] [phone]           Signed: Izora Gala 01/17/2017, 11:06 AM

## 2017-01-19 ENCOUNTER — Encounter (HOSPITAL_COMMUNITY): Payer: Self-pay | Admitting: Otolaryngology

## 2017-01-19 NOTE — Anesthesia Postprocedure Evaluation (Signed)
Anesthesia Post Note  Patient: Sydney Stephens  Procedure(s) Performed: Procedure(s) (LRB): THYROIDECTOMY (N/A)     Anesthesia Type: General    Last Vitals:  Vitals:   01/17/17 0520 01/17/17 1006  BP: 105/64 110/60  Pulse: 60 76  Resp: 18 18  Temp: 37 C 37.1 C    Last Pain:  Vitals:   01/17/17 1006  TempSrc: Oral  PainSc:                  Ryan P Ellender

## 2017-09-21 ENCOUNTER — Encounter (HOSPITAL_BASED_OUTPATIENT_CLINIC_OR_DEPARTMENT_OTHER): Payer: Self-pay | Admitting: Emergency Medicine

## 2017-09-21 ENCOUNTER — Other Ambulatory Visit: Payer: Self-pay

## 2017-09-21 ENCOUNTER — Emergency Department (HOSPITAL_BASED_OUTPATIENT_CLINIC_OR_DEPARTMENT_OTHER): Payer: 59

## 2017-09-21 ENCOUNTER — Emergency Department (HOSPITAL_BASED_OUTPATIENT_CLINIC_OR_DEPARTMENT_OTHER)
Admission: EM | Admit: 2017-09-21 | Discharge: 2017-09-21 | Disposition: A | Discharge status: Home / Self Care | Payer: 59 | Attending: Emergency Medicine | Admitting: Emergency Medicine

## 2017-09-21 DIAGNOSIS — S060X9A Concussion with loss of consciousness of unspecified duration, initial encounter: Secondary | ICD-10-CM | POA: Diagnosis not present

## 2017-09-21 DIAGNOSIS — Z79899 Other long term (current) drug therapy: Secondary | ICD-10-CM | POA: Insufficient documentation

## 2017-09-21 DIAGNOSIS — W1839XA Other fall on same level, initial encounter: Secondary | ICD-10-CM | POA: Diagnosis not present

## 2017-09-21 DIAGNOSIS — Z87891 Personal history of nicotine dependence: Secondary | ICD-10-CM | POA: Diagnosis not present

## 2017-09-21 DIAGNOSIS — E039 Hypothyroidism, unspecified: Secondary | ICD-10-CM | POA: Insufficient documentation

## 2017-09-21 DIAGNOSIS — S0990XA Unspecified injury of head, initial encounter: Secondary | ICD-10-CM

## 2017-09-21 DIAGNOSIS — Y929 Unspecified place or not applicable: Secondary | ICD-10-CM | POA: Insufficient documentation

## 2017-09-21 DIAGNOSIS — Y9341 Activity, dancing: Secondary | ICD-10-CM | POA: Insufficient documentation

## 2017-09-21 DIAGNOSIS — Y999 Unspecified external cause status: Secondary | ICD-10-CM | POA: Diagnosis not present

## 2017-09-21 DIAGNOSIS — S098XXA Other specified injuries of head, initial encounter: Secondary | ICD-10-CM | POA: Diagnosis present

## 2017-09-21 NOTE — ED Notes (Signed)
Patient transported to CT 

## 2017-09-21 NOTE — ED Notes (Signed)
ED Provider at bedside. 

## 2017-09-21 NOTE — ED Provider Notes (Signed)
[facility] Provider Note   CSN: [id number] Arrival date & time: 09/21/17  1845     History   Chief Complaint Chief Complaint  Patient presents with  . Head Injury    HPI Sydney Stephens is a 47 y.o. female Sydney Stephens for evaluation of a head injury that occurred approximately 5 weeks ago.  Patient reports that 5 weeks ago, she was dancing and states that she tripped, causing her to fall backwards and land on the floor. Patient reports that she did have +LOC. She was able to get up and ambulate after the incident. Afterwards, she did not seek medical care.  Patient reports that since then she has had a number of symptoms, including diffuse headache, difficulty concentrating, "not feeling quite right,"short-term memory issues. She reports she has had some intermittent blurry vision. Denies any now.  She also reports that she has had intermittent shooting pain down her right upper extremity.  Patient states she has not taken any medication for her symptoms.  Patient reports that she has not followed up with any primary care doctor regarding her symptoms.  Patient states that she came into the ED today because she was tired of feeling the symptoms and wanted to be checked out.  Patient reports that she has been able to attend her work and had completed her job during the 5 weeks.  She has been able to resume her normal activity.  She denies any difficulty walking.  Patient is not currently on blood thinners.  Patient denies any neck pain, back pain, chest pain, difficulty breathing, numbness/weakness of her arms or legs, saddle anesthesia, urinary incontinence.  The history is provided by the patient.    Past Medical History:  Diagnosis Date  . GERD (gastroesophageal reflux disease)   . Hypothyroidism   . PONV (postoperative nausea and vomiting)     Patient Active Problem List   Diagnosis Date Noted  . 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 BREAST LUMPECTOMY    . LAPAROTOMY    . THYROIDECTOMY N/A 01/16/2017   Procedure: THYROIDECTOMY;  Surgeon: Izora Gala, MD;  Location: [facility];  Service: ENT;  Laterality: N/A;  RNFA ([place])  . TONSILLECTOMY    . wisdom teeth      OB History    No data available       Home Medications    Prior to Admission medications   Medication Sig Start Date End Date Taking? Authorizing Provider  Calcium Carb-Cholecalciferol (CALCIUM 600 + D PO) Take 2 tablets by mouth daily.    [provider]  diphenhydrAMINE (BENADRYL) 25 MG tablet Take 25 mg by mouth at bedtime.    [provider]  HYDROcodone-acetaminophen (NORCO) 7.5-325 MG tablet Take 1 tablet by mouth every 6 (six) hours as needed for moderate pain. 01/16/17   Izora Gala, MD  levothyroxine (SYNTHROID, LEVOTHROID) 75 MCG tablet Take 75 mcg by mouth daily. 11/26/16   [provider]  loratadine (CLARITIN) 10 MG tablet Take 10 mg by mouth daily.    [provider]  Magnesium Oxide 500 MG CAPS Take 1,000 mg by mouth daily.    [provider]  promethazine (PHENERGAN) 25 MG suppository Place 1 suppository (25 mg total) rectally every 6 (six) hours as needed for nausea or vomiting. 01/16/17   Izora Gala, MD  ranitidine (ZANTAC) 150 MG capsule Take 150 mg by mouth 2 (two) times daily.  [provider]  tetrahydrozoline-zinc (VISINE-AC) 0.05-0.25 % ophthalmic solution Place 1 drop into both eyes daily.    [provider]    Family History Family History  Problem Relation Age of Onset  . Diabetes Mother   . COPD Mother   . High blood pressure Mother   . Prostate cancer Father   . Asthma Brother     Social History Social History   Tobacco Use  . Smoking status: Former Smoker    Types: Cigarettes    Last attempt to quit: 2012    Years since quitting: 7.1  . Smokeless tobacco: Never Used  Substance Use Topics  . Alcohol use: Yes     Comment: occ  . Drug use: No     Allergies   No known allergies   Review of Systems Review of Systems  Constitutional: Negative for chills and fever.  Eyes: Positive for visual disturbance.  Respiratory: Negative for cough and shortness of breath.   Cardiovascular: Negative for chest pain.  Gastrointestinal: Negative for abdominal pain, diarrhea, nausea and vomiting.  Genitourinary: Negative for dysuria and hematuria.  Musculoskeletal: Negative for back pain and neck pain.       Arm pain  Skin: Negative for rash.  Neurological: Positive for headaches. Negative for dizziness, weakness and numbness.  Psychiatric/Behavioral: Negative for confusion.       Difficulty concentrating   All other systems reviewed and are negative.    Physical Exam Updated Vital Signs BP 133/66 (BP Location: Left Arm)   Pulse 86   Temp 99.2 F (37.3 C) (Oral)   Resp 18   Ht 5\' 10"  (1.778 m)   Wt 88.5 kg (195 lb)   SpO2 99%   BMI 27.98 kg/m   Physical Exam  Constitutional: She is oriented to person, place, and time. She appears well-developed and well-nourished.  Sitting comfortably on examination table  HENT:  Head: Normocephalic and atraumatic.  Right Ear: Tympanic membrane normal. No hemotympanum.  Left Ear: Tympanic membrane normal. No hemotympanum.  Mouth/Throat: Oropharynx is clear and moist and mucous membranes are normal.  No tenderness to palpation of skull. No deformities or crepitus noted. Small 1 cm hematoma noted to the upper posterior scalp. No open wounds, abrasions or lacerations.   Eyes: Conjunctivae, EOM and lids are normal. Pupils are equal, round, and reactive to light.  Neck: Full passive range of motion without pain.  Full flexion/extension and lateral movement of neck fully intact. No bony midline tenderness. No deformities or crepitus. Positive Spurling's test.   Cardiovascular: Normal rate, regular rhythm, normal heart sounds and normal pulses. Exam reveals no gallop  and no friction rub.  No murmur heard. Pulses:      Radial pulses are 2+ on the right side, and 2+ on the left side.  Pulmonary/Chest: Effort normal and breath sounds normal.  Abdominal: Soft. Normal appearance. There is no tenderness. There is no rigidity and no guarding.  Musculoskeletal: Normal range of motion.  No tenderness palpation to the right upper extremity.  No deformity or crepitus noted.  Full range of motion of bilateral upper extremities without difficulty.  Neurological: She is alert and oriented to person, place, and time.  Cranial nerves III-XII intact Follows commands, Moves all extremities  5/5 strength to BUE and BLE  Sensation intact throughout all major nerve distributions Normal finger to nose. No dysdiadochokinesia. No pronator drift. No gait abnormalities  Negative Rhomberg Normal heel-to-toe walk No slurred speech. No facial droop.   Skin:  Skin is warm and dry. Capillary refill takes less than 2 seconds.  Psychiatric: She has a normal mood and affect. Her speech is normal.  Nursing note and vitals reviewed.    ED Treatments / Results  Labs (all labs ordered are listed, but only abnormal results are displayed) Labs Reviewed - No data to display  EKG  EKG Interpretation None       Radiology Ct Head Wo Contrast  Result Date: 09/21/2017 CLINICAL DATA:  [place] 5 weeks ago while dancing with trauma to the back of the head. Memory loss and tinnitus since then. Some headache and confusion. EXAM: CT HEAD WITHOUT CONTRAST TECHNIQUE: Contiguous axial images were obtained from the base of the skull through the vertex without intravenous contrast. COMPARISON:  None. FINDINGS: Brain: The brain shows a normal appearance without evidence of malformation, atrophy, old or acute small or large vessel infarction, mass lesion, hemorrhage, hydrocephalus or extra-axial collection. Vascular: No hyperdense vessel. No evidence of atherosclerotic calcification. Skull: Normal.   No traumatic finding.  No focal bone lesion. Sinuses/Orbits: Sinuses are clear. Orbits appear normal. Mastoids are clear. Other: None significant IMPRESSION: Normal head CT.  No traumatic finding. Electronically Signed   By: Nelson Chimes M.D.   On: 09/21/2017 21:35    Procedures Procedures (including critical care time)  Medications Ordered in ED Medications - No data to display   Initial Impression / Assessment and Plan / ED Course  I have reviewed the triage vital signs and the nursing notes.  Pertinent labs & imaging results that were available during my care of the patient were reviewed by me and considered in my medical decision making (see chart for details).     47 year old female who presents for evaluation of head injury that occurred approximate 5 weeks ago.  Patient did have LOC at that time.  Has been able to complete her normal activities since then.  Does report constellation of symptoms since the incident, generalized headache, difficulty concentrating, not feeling right, intermittent blurry vision and intermittent pain down the right upper extremity. Patient is afebrile, non-toxic appearing, sitting comfortably on examination table. Vital signs reviewed and stable. No neuro deficits noted on exam.  Given duration of symptoms and general appearance on physical exam, low suspicion for ICH or skull fracture.  Symptoms may be result of postconcussion syndrome.  CT scan ordered at triage. Given reassuring exam and NEXUS criteria, no indication for cervical imaging at this time.   CT scan reviewed.  Negative for any acute abnormality.  No evidence of ICH, skull deformity.  I discussed results with patient.  Suspect that symptoms are likely secondary to concussion.  Given 5-week duration of and symptoms presentation, do not suspect subarachnoid hemorrhage and do not feel that LP is indicated in this incident.  I suspect the patient's right arm pain is more radicular pain as she is  positive Spurling's.  He describes it as a shooting and sharp pain.  On my exam, patient has symmetric strength in bilateral upper extremities and sensation is intact.  Equal grip strength bilaterally.  No weakness or neuro deficits noted.  Patient does not need any further imaging here in the ED.  If symptoms continue, patient may need a outpatient MRI at some point for further evaluation.  We will plan to provide patient with outpatient neuro and concussion clinic for follow-up of symptoms. Discussed patient with Dr. Sherry Ruffing who agrees with plan. Patient had ample opportunity for questions and discussion. All patient's questions were  answered with full understanding. Strict return precautions discussed. Patient expresses understanding and agreement to plan.     Final Clinical Impressions(s) / ED Diagnoses   Final diagnoses:  Injury of head, initial encounter  Concussion with loss of consciousness, initial encounter    ED Discharge Orders    None       Desma Mcgregor 09/22/17 2231    Tegeler, Gwenyth Allegra, MD 09/23/17 [phone]

## 2017-09-21 NOTE — Discharge Instructions (Signed)
As we discussed, you need to follow-up with you through the concussion clinic or the referred neurology.  Her numbers are provided in the paperwork.  As we discussed, engage in brain rest will help with her symptoms, including limiting screen time.   Return to the ED for any vision changes, worsening headache, difficulty walking, numbness/weakness of her arms or legs, or any other worsening or concerning symptoms.

## 2017-09-21 NOTE — ED Triage Notes (Addendum)
Reports head injury 5 weeks ago and states "I'm not feeling good".  States "worse now than its been".  States head is still bruised.  Reports "I feel a difference in the smoothness of my head".  States she feel dancing and hit wood floor.  Reports injury to posterior head.  States never seen for this.  Reports nausea the day after but never vomited but reports short term memory issues and disorientation the day after she drinks since event occurred.

## 2017-09-22 ENCOUNTER — Telehealth: Payer: Self-pay

## 2017-09-22 NOTE — Telephone Encounter (Signed)
Patient was perform shag dancing 5 weeks ago on 08/19/2017 when she lost her footing and hit her head. She did lose consciousness. She had ringing in her ears, nausea, and head soreness. These symptoms improved but she does still occasionally has tinnitus and "zinging" in her head and memory issues. Patient went into ER yesterday as she was concerned that the pain in her head where she had the impact returned.  Head CT was performed. Patient has returned to half days at work. The computer makes her fatigued and she finds it hard to concentrate. Patient has also danced twice since injury and states that she consumed a small amount of alcohol during these recitals. She said that the next day she does not feel as good as she usually does when she attends the dance weekends. Patient given red flag signs and symptoms of sudden onset headache, dizziness, nausea, vomiting, visual disturbances and disorientation would warrant a trip to ER. Patient voices understanding.

## 2017-09-27 NOTE — Progress Notes (Signed)
Subjective:   I, Sydney Stephens, am serving as a scribe for Dr. Hulan Saas, DO.  Chief Complaint: Sydney Stephens, DOB: 1970/09/30, is a 47 y.o. female who presents for head injury sustained on 08/19/2017. Patient was performing the shag dance at an event when she tripped and fell hitting the posterior aspect of her head on the floor. She did lose consciousness she states. Patient feels that she has head soreness still at the area of impact. She believes that she has been having memory issues and ringing in her ears after the accident. Patient has been having intermittent headaches since accident. 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    Injury date : 08/19/2017 Visit #: 1  History of Present Illness:   Patient's goals/priorities: Return to baseline   Concussion Self-Reported Symptom Score Symptoms rated on a scale 1-6, in last 24 hours  Headache:  0  Nausea: 0  Vomiting: 0  Balance Difficulty: 4  Dizziness: 2  Fatigue: 4  Trouble Falling Asleep: 4   Sleep More Than Usual: 0  Sleep Less Than Usual: 6  Daytime Drowsiness: 0  Photophobia: 6  Phonophobia: 6  Irritability: 4  Sadness: 0  Nervousness: 4  Feeling More Emotional: 0  Numbness or Tingling: 5  Feeling Slowed Down: 4  Feeling Mentally Foggy: 4  Difficulty Concentrating: 5  Difficulty Remembering: 6  Visual Problems: 3    Total Symptom Score:67   Review of Systems: Pertinent items are noted in HPI.  Review of History: Past Medical History: @PMHP @  Past Surgical History:  has a past surgical history that includes laparotomy; Tonsillectomy; Breast lumpectomy; wisdom teeth; and Thyroidectomy (N/A, 01/16/2017). Family History: family history includes Asthma in her brother; COPD in her mother; Diabetes in her mother; High blood pressure in her mother; Prostate cancer in her father. Social History:  reports that she  quit smoking about 7 years ago. Her smoking use included cigarettes. she has never used smokeless tobacco. She reports that she drinks alcohol. She reports that she does not use drugs. Current Medications: has a current medication list which includes the following prescription(s): calcium carb-cholecalciferol, diphenhydramine, hydrocodone-acetaminophen, levothyroxine, loratadine, magnesium oxide, promethazine, ranitidine, tetrahydrozoline-zinc, and diclofenac sodium. Allergies: is allergic to no known allergies.  Objective:    Physical Examination Vitals:   09/29/17 0854  BP: 112/80  Pulse: 90  SpO2: 98%   General appearance: alert, appears stated age and cooperative Head: Normocephalic, without obvious abnormality, atraumatic Eyes: conjunctivae/corneas clear. PERRL, EOM's intact. Fundi benign. Sclera anicteric. Lungs: clear to auscultation bilaterally and percussion Heart: regular rate and rhythm, S1, S2 normal, no murmur, click, rub or gallop Neurologic: CN 2-12 normal.  Sensation to pain, touch, and proprioception normal.  DTRs  normal in upper and lower extremities. No pathologic reflexes. Neg rhomberg, modified rhomberg, pronator drift, tandem gait, finger-to-nose; see post-concussion vestibular and oculomotor testing in chart Psychiatric: Oriented X3, intact recent and remote memory, judgement and insight, normal mood and affect  Concussion testing performed today: Patient shows that patient did not reliably attempt on the exam at all.  Neurocognitive testing (ImPACT):   Post #1:    Verbal Memory Composite  59 (5%)   Visual Memory Composite  65 (49%)   Visual Motor Speed Composite  29.58 (23%)   Reaction Time Composite  .89 (7%)   Cognitive Efficiency Index  0.00  Vestibular Screening:       Headache  Dizziness  Smooth Pursuits n n  H. Saccades n n  V. Saccades n n  H. VOR n n  V. VOR n n  Visual Motor Sensitivity n n      Convergence: 8.5 cm  n n     I was  personally involved with the physical evaluation of and am in agreement with the assessment and treatment plan for this patient.  Greater than 50% of this encounter was spent in direct consultation with the patient in evaluation, counseling, and coordination of care. Duration of encounter: 60 minutes.  After Visit Summary printed out and provided to patient as appropriate.

## 2017-09-29 ENCOUNTER — Ambulatory Visit: Payer: 59 | Admitting: Family Medicine

## 2017-09-29 ENCOUNTER — Encounter: Payer: Self-pay | Admitting: Family Medicine

## 2017-09-29 DIAGNOSIS — R519 Headache, unspecified: Secondary | ICD-10-CM | POA: Insufficient documentation

## 2017-09-29 DIAGNOSIS — G4489 Other headache syndrome: Secondary | ICD-10-CM

## 2017-09-29 DIAGNOSIS — R51 Headache: Secondary | ICD-10-CM

## 2017-09-29 MED ORDER — DICLOFENAC SODIUM 2 % TD SOLN: 2.0000 g | Freq: Two times a day (BID) | TRANSDERMAL | 3 refills | Status: DC

## 2017-09-29 NOTE — Assessment & Plan Note (Signed)
Patient does have more of a headache.  I think that is not a concussion at this time.  Patient does have the pain medication she has been taking and we discussed that this could be some mild withdrawal.  Patient is also going through what appears to be menopause is likely contributing to potentially this.  Discussed with patient about natural ways to potentially see if we can have patient improved.  Discussed icing regimen and home exercises.  We discussed if any worsening symptoms we can consider neurology.  No need to follow-up as long as patient does well in the next 2 weeks.  Patient wants to try full duty at work.

## 2017-09-29 NOTE — Patient Instructions (Signed)
Good to see you  I do not think you have a concussion  Iron 65mg  daily with 500mg  of vitamin C Vitamin D 2000 IU dialy  Black cohash 200mg  daily  pennsaid pinkie amount topically 2 times daily as needed. To the shoulder Keep hands within peripheral vision  Consider prilosec 200 mg daIly See me again in 2 weeks if not perfect

## 2017-10-14 ENCOUNTER — Ambulatory Visit: Payer: 59 | Admitting: Family Medicine

## 2017-11-06 ENCOUNTER — Ambulatory Visit: Payer: Self-pay | Admitting: Diagnostic Neuroimaging

## 2018-02-10 ENCOUNTER — Encounter (INDEPENDENT_AMBULATORY_CARE_PROVIDER_SITE_OTHER): Payer: Self-pay | Admitting: Orthopaedic Surgery

## 2018-02-10 ENCOUNTER — Ambulatory Visit (INDEPENDENT_AMBULATORY_CARE_PROVIDER_SITE_OTHER): Payer: 59 | Admitting: Orthopaedic Surgery

## 2018-02-10 ENCOUNTER — Ambulatory Visit (INDEPENDENT_AMBULATORY_CARE_PROVIDER_SITE_OTHER): Payer: Self-pay

## 2018-02-10 VITALS — BP 107/67 | HR 95 | Ht 70.0 in | Wt 184.0 lb

## 2018-02-10 DIAGNOSIS — M545 Low back pain: Secondary | ICD-10-CM

## 2018-02-10 NOTE — Progress Notes (Signed)
Office Visit Note   Patient: Sydney Stephens           Date of Birth: 1970-10-31           MRN: [id number] Visit Date: 02/10/2018              Requested by: Linda Hedges, [address], [address], [place], [place] [address] PCP: Linda Hedges, DO   Assessment & Plan: Visit Diagnoses:  1. Acute right-sided low back pain, with sciatica presence unspecified     Plan: We will place patient on prednisone Dosepak.  I will recheck her in a month if she gets resolution of her symptoms she can call and cancel we reviewed the x-rays in comparison to previous x-rays 9 years ago.  She is 50% better since onset of symptoms 01/24/2018.  Likely has some foraminal narrowing and with disc bulge with her onset of symptoms.  Recheck 1 month.  Follow-Up Instructions: No follow-ups on file.   Orders:  Orders Placed This Encounter  Procedures  . XR Lumbar Spine 2-3 Views   No orders of the defined types were placed in this encounter.     Procedures: No procedures performed   Clinical Data: No additional findings.   Subjective: Chief Complaint  Patient presents with  . Lower Back - Pain    HPI 47 year old female returns with sharp pain in her back to start on 01/24/2018.  She states her back locked up she was going to sit down she is used a TENS unit without relief she said sharp pain that radiates to the right hip lateral thigh anterior thigh and stops at about the knee.  She sits at work working for [employer].  She denies any associated bowel or bladder symptoms no chills or fever.  She is had previous trochanteric injection when she is had symptoms.  Recently checked negative for diabetes.  She states she is gotten probably 50% better since the onset of her symptoms.  Review of Systems positive for thyroid disorder with thyroidectomy and some vocal cord paralysis with improvement in her voice.  Positive for GERD, lumbar disc degeneration.  Previous  smoker.   Objective: Vital Signs: BP 107/67   Pulse 95   Ht 5\' 10"  (1.778 m)   Wt 184 lb (83.5 kg)   BMI 26.40 kg/m   Physical Exam  Constitutional: She is oriented to person, place, and time. She appears well-developed.  HENT:  Head: Normocephalic.  Right Ear: External ear normal.  Left Ear: External ear normal.  Eyes: Pupils are equal, round, and reactive to light.  Neck: No tracheal deviation present. No thyromegaly present.  Cardiovascular: Normal rate.  Pulmonary/Chest: Effort normal.  Abdominal: Soft.  Neurological: She is alert and oriented to person, place, and time.  Skin: Skin is warm and dry.  Psychiatric: She has a normal mood and affect. Her behavior is normal.    Ortho Exam patient has pain with palpation over the right mid upper lumbar region.  No sciatic notch tenderness no significant tenderness over the greater trochanter.  Normal hip range of motion negative logroll hip flexors reproduce some of her back pain but no way hip flexion weakness quads are strong abductors are strong anterior tib gastrocsoleus is strong knee and ankle jerk 2+ and symmetrical normal sensation to her feet pulses are normal.  Specialty Comments:  No specialty comments available.  Imaging: No results found.   PMFS History: Patient Active  Problem List   Diagnosis Date Noted  . Headache 09/29/2017  . 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 GERD (gastroesophageal reflux disease)   . Hypothyroidism   . PONV (postoperative nausea and vomiting)     Family History  Problem Relation Age of Onset  . Diabetes Mother   . COPD Mother   . High blood pressure Mother   . Prostate cancer Father   . Asthma Brother     Past Surgical History:  Procedure Laterality Date  . BREAST LUMPECTOMY    . LAPAROTOMY    . THYROIDECTOMY N/A 01/16/2017   Procedure: THYROIDECTOMY;  Surgeon: Izora Gala, MD;  Location: [facility];  Service: ENT;  Laterality: N/A;   RNFA ([place])  . TONSILLECTOMY    . wisdom teeth     Social History   Occupational History  . Not on file  Tobacco Use  . Smoking status: Former Smoker    Types: Cigarettes    Last attempt to quit: 2012    Years since quitting: 7.5  . Smokeless tobacco: Never Used  Substance and Sexual Activity  . Alcohol use: Yes    Comment: occ  . Drug use: No  . Sexual activity: Yes

## 2018-03-05 ENCOUNTER — Ambulatory Visit (INDEPENDENT_AMBULATORY_CARE_PROVIDER_SITE_OTHER): Payer: Self-pay | Admitting: Orthopaedic Surgery

## 2018-07-23 ENCOUNTER — Other Ambulatory Visit: Payer: Self-pay

## 2018-07-23 ENCOUNTER — Encounter (HOSPITAL_BASED_OUTPATIENT_CLINIC_OR_DEPARTMENT_OTHER): Payer: Self-pay | Admitting: *Deleted

## 2018-07-23 ENCOUNTER — Emergency Department (HOSPITAL_BASED_OUTPATIENT_CLINIC_OR_DEPARTMENT_OTHER): Payer: 59

## 2018-07-23 ENCOUNTER — Emergency Department (HOSPITAL_BASED_OUTPATIENT_CLINIC_OR_DEPARTMENT_OTHER)
Admission: EM | Admit: 2018-07-23 | Discharge: 2018-07-24 | Disposition: A | Discharge status: Home / Self Care | Payer: 59 | Attending: Emergency Medicine | Admitting: Emergency Medicine

## 2018-07-23 DIAGNOSIS — Z87891 Personal history of nicotine dependence: Secondary | ICD-10-CM | POA: Insufficient documentation

## 2018-07-23 DIAGNOSIS — R0602 Shortness of breath: Secondary | ICD-10-CM | POA: Diagnosis not present

## 2018-07-23 DIAGNOSIS — E039 Hypothyroidism, unspecified: Secondary | ICD-10-CM | POA: Insufficient documentation

## 2018-07-23 DIAGNOSIS — Z79899 Other long term (current) drug therapy: Secondary | ICD-10-CM | POA: Diagnosis not present

## 2018-07-23 DIAGNOSIS — R0789 Other chest pain: Secondary | ICD-10-CM | POA: Diagnosis not present

## 2018-07-23 DIAGNOSIS — R002 Palpitations: Secondary | ICD-10-CM | POA: Diagnosis not present

## 2018-07-23 LAB — CBC WITH DIFFERENTIAL/PLATELET
Abs Immature Granulocytes: 0.02 10*3/uL (ref 0.00–0.07)
BASOS ABS: 0 10*3/uL (ref 0.0–0.1)
Basophils Relative: 0 %
EOS ABS: 0.1 10*3/uL (ref 0.0–0.5)
EOS PCT: 1 %
HCT: 41.6 % (ref 36.0–46.0)
Hemoglobin: 13.2 g/dL (ref 12.0–15.0)
Immature Granulocytes: 0 %
LYMPHS PCT: 28 %
Lymphs Abs: 2.4 10*3/uL (ref 0.7–4.0)
MCH: 30.2 pg (ref 26.0–34.0)
MCHC: 31.7 g/dL (ref 30.0–36.0)
MCV: 95.2 fL (ref 80.0–100.0)
Monocytes Absolute: 0.5 10*3/uL (ref 0.1–1.0)
Monocytes Relative: 5 %
NRBC: 0 % (ref 0.0–0.2)
Neutro Abs: 5.6 10*3/uL (ref 1.7–7.7)
Neutrophils Relative %: 66 %
PLATELETS: 325 10*3/uL (ref 150–400)
RBC: 4.37 MIL/uL (ref 3.87–5.11)
RDW: 12.5 % (ref 11.5–15.5)
WBC: 8.6 10*3/uL (ref 4.0–10.5)

## 2018-07-23 LAB — COMPREHENSIVE METABOLIC PANEL
ALT: 12 U/L (ref 0–44)
ANION GAP: 11 (ref 5–15)
AST: 16 U/L (ref 15–41)
Albumin: 4.6 g/dL (ref 3.5–5.0)
Alkaline Phosphatase: 80 U/L (ref 38–126)
BUN: 19 mg/dL (ref 6–20)
CALCIUM: 9.4 mg/dL (ref 8.9–10.3)
CO2: 26 mmol/L (ref 22–32)
Chloride: 101 mmol/L (ref 98–111)
Creatinine, Ser: 0.92 mg/dL (ref 0.44–1.00)
GFR calc non Af Amer: >60 mL/min (ref >60)
Glucose, Bld: 101 mg/dL — ABNORMAL HIGH (ref 70–99)
POTASSIUM: 3.6 mmol/L (ref 3.5–5.1)
SODIUM: 138 mmol/L (ref 135–145)
Total Bilirubin: 0.5 mg/dL (ref 0.3–1.2)
Total Protein: 8.4 g/dL — ABNORMAL HIGH (ref 6.5–8.1)

## 2018-07-23 LAB — D-DIMER, QUANTITATIVE: D-Dimer, Quant: 0.3 ug/mL-FEU (ref 0.00–0.50)

## 2018-07-23 LAB — TROPONIN I

## 2018-07-23 MED ORDER — SODIUM CHLORIDE 0.9 % IV BOLUS
1000.0000 mL | Freq: Once | INTRAVENOUS | Status: AC
  Administered 2018-07-23: 1000 mL via INTRAVENOUS

## 2018-07-23 NOTE — ED Notes (Signed)
EKG completed

## 2018-07-23 NOTE — ED Triage Notes (Signed)
Palpation and rapid hr x 2.5 hours    Rate of 100 -140  At rest   Nausea,  Pain  To left arm, neck, shoulder  Diff taking deep breath at times

## 2018-07-23 NOTE — ED Notes (Signed)
RT attempting IV.

## 2018-07-23 NOTE — ED Notes (Signed)
Patient transported to X-ray 

## 2018-07-23 NOTE — ED Provider Notes (Signed)
[facility] EMERGENCY DEPARTMENT Provider Note   CSN: [id number] Arrival date & time: 07/23/18  2039     History   Chief Complaint Chief Complaint  Patient presents with  . Palpitations    HPI Sydney Stephens is a 47 y.o. female.  Patient is a 47 year old female with a history of recently diagnosed hyperlipidemia and hypothyroidism status post goiter removal who presents with palpitations.  She states that she was driving and had onset where she felt like her heart was racing.  Her fitness watch checked her heart rate up to the 190s.  She said during that time she had some chest tightness and discomfort in her left arm.  She had some associated shortness of breath.  Those symptoms have resolved and she is feeling better but her heart rate is been over 100 for the last couple of hours.  She denies any recent change in her thyroid medicine.  She states her TSH was checked several months ago.  No fevers coughing or recent illnesses.  She did have a recent drive to [place].  She denies any leg pain or swelling.  She quit smoking 8 years ago.  She has no family history of early heart disease.  No history of diabetes or hypertension.  Her OB/GYN recently told her that her cholesterol was elevated and there referring her to follow-up with her PCP.     Past Medical History:  Diagnosis Date  . GERD (gastroesophageal reflux disease)   . Hypothyroidism   . PONV (postoperative nausea and vomiting)     Patient Active Problem List   Diagnosis Date Noted  . Headache 09/29/2017  . 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 BREAST LUMPECTOMY    . LAPAROTOMY    . THYROIDECTOMY N/A 01/16/2017   Procedure: THYROIDECTOMY;  Surgeon: Izora Gala, MD;  Location: [facility];  Service: ENT;  Laterality: N/A;  RNFA ([place])  . TONSILLECTOMY    . wisdom teeth       OB History   No obstetric history on file.      Home Medications     Prior to Admission medications   Medication Sig Start Date End Date Taking? Authorizing Provider  Calcium Carb-Cholecalciferol (CALCIUM 600 + D PO) Take 2 tablets by mouth daily.    [provider]  Diclofenac Sodium 2 % SOLN Place 2 g onto the skin 2 (two) times daily. 09/29/17   Lyndal Pulley, DO  diphenhydrAMINE (BENADRYL) 25 MG tablet Take 25 mg by mouth at bedtime.    [provider]  HYDROcodone-acetaminophen (NORCO) 7.5-325 MG tablet Take 1 tablet by mouth every 6 (six) hours as needed for moderate pain. Patient not taking: Reported on 02/10/2018 01/16/17   Izora Gala, MD  levothyroxine (SYNTHROID, LEVOTHROID) 75 MCG tablet Take 75 mcg by mouth daily. 11/26/16   [provider]  loratadine (CLARITIN) 10 MG tablet Take 10 mg by mouth daily.    [provider]  Magnesium Oxide 500 MG CAPS Take 1,000 mg by mouth daily.    [provider]  Magnesium Oxide, Antacid, 500 MG CAPS Take by mouth.    [provider]  phentermine 15 MG capsule Take by mouth daily. 01/14/18   [provider]  promethazine (PHENERGAN) 25 MG suppository Place 1 suppository (25 mg total) rectally every 6 (six) hours as needed for nausea or vomiting. Patient not taking: Reported on 02/10/2018 01/16/17  Izora Gala, MD  ranitidine (ZANTAC) 150 MG capsule Take 150 mg by mouth 2 (two) times daily.    [provider]  tetrahydrozoline-zinc (VISINE-AC) 0.05-0.25 % ophthalmic solution Place 1 drop into both eyes daily.    [provider]    Family History Family History  Problem Relation Age of Onset  . Diabetes Mother   . COPD Mother   . High blood pressure Mother   . Prostate cancer Father   . Asthma Brother     Social History Social History   Tobacco Use  . Smoking status: Former Smoker    Types: Cigarettes    Last attempt to quit: 2012    Years since quitting: 7.9  . Smokeless tobacco: Never Used  Substance Use Topics  .  Alcohol use: Yes    Comment: occ  . Drug use: No     Allergies   No known allergies   Review of Systems Review of Systems  Constitutional: Negative for chills, diaphoresis, fatigue and fever.  HENT: Negative for congestion, rhinorrhea and sneezing.   Eyes: Negative.   Respiratory: Positive for shortness of breath. Negative for cough and chest tightness.   Cardiovascular: Positive for chest pain and palpitations. Negative for leg swelling.  Gastrointestinal: Negative for abdominal pain, blood in stool, diarrhea, nausea and vomiting.  Genitourinary: Negative for difficulty urinating, flank pain, frequency and hematuria.  Musculoskeletal: Negative for arthralgias and back pain.  Skin: Negative for rash.  Neurological: Negative for dizziness, speech difficulty, weakness, numbness and headaches.     Physical Exam Updated Vital Signs BP 120/71   Pulse 91   Temp 98.5 F (36.9 C) (Oral)   Resp 16   Ht 5\' 9"  (1.753 m)   Wt 75.8 kg   SpO2 100%   BMI 24.66 kg/m   Physical Exam Constitutional:      Appearance: She is well-developed.  HENT:     Head: Normocephalic and atraumatic.  Eyes:     Pupils: Pupils are equal, round, and reactive to light.  Neck:     Musculoskeletal: Normal range of motion and neck supple.  Cardiovascular:     Rate and Rhythm: Normal rate and regular rhythm.     Heart sounds: Normal heart sounds.  Pulmonary:     Effort: Pulmonary effort is normal. No respiratory distress.     Breath sounds: Normal breath sounds. No wheezing or rales.  Chest:     Chest wall: No tenderness.  Abdominal:     General: Bowel sounds are normal.     Palpations: Abdomen is soft.     Tenderness: There is no abdominal tenderness. There is no guarding or rebound.  Musculoskeletal: Normal range of motion.        General: No tenderness.     Right lower leg: No edema.     Left lower leg: No edema.  Lymphadenopathy:     Cervical: No cervical adenopathy.  Skin:    General:  Skin is warm and dry.     Findings: No rash.  Neurological:     Mental Status: She is alert and oriented to person, place, and time.      ED Treatments / Results  Labs (all labs ordered are listed, but only abnormal results are displayed) Labs Reviewed  COMPREHENSIVE METABOLIC PANEL - Abnormal; Notable for the following components:      Result Value   Glucose, Bld 101 (*)    Total Protein 8.4 (*)    All other components  within normal limits  CBC WITH DIFFERENTIAL/PLATELET  TROPONIN I  D-DIMER, QUANTITATIVE (NOT AT [facility])  TSH  TROPONIN I    EKG EKG Interpretation  Date/Time:  Friday July 23 2018 22:53:54 EST Ventricular Rate:  87 PR Interval:  116 QRS Duration: 108 QT Interval:  376 QTC Calculation: 453 R Axis:   48 Text Interpretation:  Sinus rhythm Left ventricular hypertrophy Anterolateral infarct, acute (LAD) Baseline wander in lead(s) V2 V3 V4 V5 V6 Partial missing lead(s): V2 V3 V4 V5 V6 similar to EKG from 01/2017 Confirmed by Malvin Johns [phone]) on 07/23/2018 11:09:46 PM   Radiology Dg Chest 2 View  Result Date: 07/23/2018 CLINICAL DATA:  Chest pressure EXAM: CHEST - 2 VIEW COMPARISON:  01/09/2017 FINDINGS: The heart size and mediastinal contours are within normal limits. Both lungs are clear. The visualized skeletal structures are unremarkable. IMPRESSION: No active cardiopulmonary disease. Electronically Signed   By: Donavan Foil M.D.   On: 07/23/2018 21:20    Procedures Procedures (including critical care time)  Medications Ordered in ED Medications  sodium chloride 0.9 % bolus 1,000 mL (1,000 mLs Intravenous New Bag/Given 07/23/18 2321)     Initial Impression / Assessment and Plan / ED Course  I have reviewed the triage vital signs and the nursing notes.  Pertinent labs & imaging results that were available during my care of the patient were reviewed by me and considered in my medical decision making (see chart for details).     Patient is  a 47 year old female who presents with palpitations and associated chest pain.  Her chest pain and shortness of breath was primarily associated with the palpitations.  Her initial EKG showed some slight ST depression inferiorly which I feel is mostly rate related.  A follow-up EKG looks pretty similar to her old one from 2018.  Her first troponin is negative.  Her d-dimer is negative.  I did order TSH but this is still pending.  I advised her that she can likely follow this up with her PCP.  She does not have any suggestions of thyroid storm.  She is awaiting a second troponin.  If this is normal, she likely can be discharged to follow-up outpatient with cardiology.  Dr. Stark Jock to follow-up on the second troponin.  Final Clinical Impressions(s) / ED Diagnoses   Final diagnoses:  None    ED Discharge Orders    None       Malvin Johns, MD 07/23/18 2344

## 2018-07-24 LAB — TROPONIN I: Troponin I: <0.03 ng/mL (ref <0.03)

## 2018-07-24 LAB — TSH: TSH: 1.617 u[IU]/mL (ref 0.350–4.500)

## 2018-07-24 NOTE — ED Provider Notes (Signed)
Care assumed from Dr. Tamera Punt at shift change.  Patient awaiting second troponin.  She had an episode today of palpitations and chest tightness where she reports her heart rate got up to the 190s.  She has not had any further ectopy while in the ED and second troponin has come back negative.  The patient appears comfortable and I believe is appropriate for discharge.  She will follow-up with cardiology in the near future.   Veryl Speak, MD 07/24/18 [phone]

## 2018-07-24 NOTE — Discharge Instructions (Addendum)
Limit your caffeine and alcohol intake as much as possible.  Follow-up with cardiology.  The contact information for the [facility] cardiology clinic has been provided in this discharge summary for you to call and make these arrangements.  Return to the emergency department if you develop severe chest pain, difficulty breathing, or other new and concerning symptoms.

## 2018-08-06 ENCOUNTER — Encounter: Payer: Self-pay | Admitting: Interventional Cardiology

## 2018-08-06 ENCOUNTER — Ambulatory Visit: Payer: 59 | Admitting: Interventional Cardiology

## 2018-08-06 VITALS — BP 128/68 | HR 106 | Ht 70.0 in | Wt 175.4 lb

## 2018-08-06 DIAGNOSIS — E890 Postprocedural hypothyroidism: Secondary | ICD-10-CM

## 2018-08-06 DIAGNOSIS — I259 Chronic ischemic heart disease, unspecified: Secondary | ICD-10-CM | POA: Diagnosis not present

## 2018-08-06 DIAGNOSIS — R002 Palpitations: Secondary | ICD-10-CM

## 2018-08-06 DIAGNOSIS — Z9089 Acquired absence of other organs: Secondary | ICD-10-CM

## 2018-08-06 NOTE — Progress Notes (Signed)
Cardiology Office Note:    Date:  08/06/2018   ID:  Sydney Stephens, DOB Mar 19, 1971, MRN [id number]  PCP:  Sydney Hedges, DO  Cardiologist:  No primary care provider on file.   Referring MD: Sydney Hedges, DO   Chief Complaint  Patient presents with  . Irregular Heart Beat  . Chest Pain    History of Present Illness:    Sydney Stephens is a 48 y.o. female with a hx of gastroesophageal reflux and hypothyroidism referred by Dr. Seward Carol MD and [facility] for chest pressure and palpitations following a recent emergency room visit.  Sydney Stephens began having palpitations 2 weeks ago with heart rates increasing as high as 195 bpm.  That particular episode caused her to seek help at the [facility] emergency center [facility].  Work-up was negative.  Since that time she has had palpitations.  She feels her heart rate frequently is above 100 bpm.  She denies chest pain.  She has never had syncope.  She has also had sharp fleeting chest pain that lasts seconds.  Very focal and without features of ischemia.  No exertional component.  Past Medical History:  Diagnosis Date  . GERD (gastroesophageal reflux disease)   . Hypothyroidism   . PONV (postoperative nausea and vomiting)     Past Surgical History:  Procedure Laterality Date  . BREAST LUMPECTOMY    . LAPAROTOMY    . THYROIDECTOMY N/A 01/16/2017   Procedure: THYROIDECTOMY;  Surgeon: Sydney Gala, MD;  Location: [facility];  Service: ENT;  Laterality: N/A;  RNFA ([place])  . TONSILLECTOMY    . wisdom teeth      Current Medications: Current Meds  Medication Sig  . Cholecalciferol (VITAMIN D3 SUPER STRENGTH) 50 MCG (2000 UT) CAPS Take 1 capsule by mouth daily.  . Diclofenac Sodium 2 % SOLN Place 2 g onto the skin 2 (two) times daily.  . diphenhydrAMINE (BENADRYL) 25 MG tablet Take 25 mg by mouth at bedtime.  . docusate sodium (COLACE) 100 MG capsule Take 300 mg by mouth daily.  . famotidine (PEPCID) 20 MG tablet Take 20 mg by  mouth 2 (two) times daily.  Marland Kitchen levothyroxine (SYNTHROID, LEVOTHROID) 125 MCG tablet Take 125 mcg by mouth daily.   Marland Kitchen loratadine (CLARITIN) 10 MG tablet Take 10 mg by mouth daily.  . Magnesium Oxide 500 MG CAPS Take 1,000 mg by mouth daily.  . Magnesium Oxide, Antacid, 500 MG CAPS Take by mouth.  . tetrahydrozoline-zinc (VISINE-AC) 0.05-0.25 % ophthalmic solution Place 1 drop into both eyes daily.     Allergies:   No known allergies   Social History   Socioeconomic History  . Marital status: Married    Spouse name: Not on file  . Number of children: Not on file  . Years of education: Not on file  . Highest education level: Not on file  Occupational History  . Not on file  Social Needs  . Financial resource strain: Not on file  . Food insecurity:    Worry: Not on file    Inability: Not on file  . Transportation needs:    Medical: Not on file    Non-medical: Not on file  Tobacco Use  . Smoking status: Former Smoker    Types: Cigarettes    Last attempt to quit: 2012    Years since quitting: 8.0  . Smokeless tobacco: Never Used  Substance and Sexual Activity  . Alcohol use: Yes    Comment:  occ  . Drug use: No  . Sexual activity: Yes  Lifestyle  . Physical activity:    Days per week: Not on file    Minutes per session: Not on file  . Stress: Not on file  Relationships  . Social connections:    Talks on phone: Not on file    Gets together: Not on file    Attends religious service: Not on file    Active member of club or organization: Not on file    Attends meetings of clubs or organizations: Not on file    Relationship status: Not on file  Other Topics Concern  . Not on file  Social History Narrative  . Not on file     Family History: The patient's family history includes Asthma in her brother; COPD in her mother; Diabetes in her mother; High blood pressure in her mother; Prostate cancer in her father.  ROS:   Please see the history of present illness.      Anxiety.  History of goiter with thyroidectomy 2018.  Some shortness of breath with activity.  Some shortness of breath when lying.  Anxiety.  Maternal grandfather and mother both had coronary disease.  On Synthroid replacement.  All other systems reviewed and are negative.  EKGs/Labs/Other Studies Reviewed:    The following studies were reviewed today: No new data.  All data accumulated during the emergency room visit has been reviewed.  EKG:  EKG is sinus rhythm at 94 bpm with otherwise normal appearance  Recent Labs: 07/23/2018: ALT 12; BUN 19; Creatinine, Ser 0.92; Hemoglobin 13.2; Platelets 325; Potassium 3.6; Sodium 138; TSH 1.617  Recent Lipid Panel No results found for: CHOL, TRIG, HDL, CHOLHDL, VLDL, LDLCALC, LDLDIRECT  Physical Exam:    VS:  BP 128/68   Pulse (!) 106   Ht 5\' 10"  (1.778 m)   Wt 175 lb 6.4 oz (79.6 kg)   SpO2 98%   BMI 25.17 kg/m     Wt Readings from Last 3 Encounters:  08/06/18 175 lb 6.4 oz (79.6 kg)  07/23/18 167 lb (75.8 kg)  02/10/18 184 lb (83.5 kg)     GEN: Tall.. No acute distress HEENT: Normal NECK: No JVD. LYMPHATICS: No lymphadenopathy CARDIAC: RRR.  No murmur, gallop, edema VASCULAR: Pulses 2+ and symmetric carotid, radial, and posterior tibial bilateral, Bruits are absent RESPIRATORY:  Clear to auscultation without rales, wheezing or rhonchi  ABDOMEN: Soft, non-tender, non-distended, No pulsatile mass, MUSCULOSKELETAL: No deformity  SKIN: Warm and dry NEUROLOGIC:  Alert and oriented x 3 PSYCHIATRIC:  Normal affect   ASSESSMENT:    1. Palpitations   2. Chest pain due to myocardial ischemia, unspecified ischemic chest pain type   3. S/P total thyroidectomy    PLAN:    In order of problems listed above:  1. Possible PSVT.  Her wearable device demonstrates heart rates at times popping up above 180 bpm.  The index event was associated with heart racing, device indicator of heart rate 190, lasting 20 minutes.  We will plan to  perform a 2D Doppler echocardiogram and wear a 30-day continuous monitor to exclude PSVT or evidence of accessory pathway.  May also need to consider the impact of thyroid replacement therapy. 2. I do not believe the chest pain requires further evaluation at this time as it is very atypical.  If any abnormalities on echo are noted that suggest regional wall motion abnormality, perhaps we would reconsider.  Further follow-up based upon findings.  Medication Adjustments/Labs and Tests Ordered: Current medicines are reviewed at length with the patient today.  Concerns regarding medicines are outlined above.  Orders Placed This Encounter  Procedures  . Cardiac event monitor  . EKG 12-Lead  . ECHOCARDIOGRAM COMPLETE   No orders of the defined types were placed in this encounter.   Patient Instructions  Medication Instructions:  Your physician recommends that you continue on your current medications as directed. Please refer to the Current Medication list given to you today.  If you need a refill on your cardiac medications before your next appointment, please call your pharmacy.   Lab work: None If you have labs (blood work) drawn today and your tests are completely normal, you will receive your results only by: Marland Kitchen MyChart Message (if you have MyChart) OR . A paper copy in the mail If you have any lab test that is abnormal or we need to change your treatment, we will call you to review the results.  Testing/Procedures: Your physician has requested that you have an echocardiogram. Echocardiography is a painless test that uses sound waves to create images of your heart. It provides your doctor with information about the size and shape of your heart and how well your heart's chambers and valves are working. This procedure takes approximately one hour. There are no restrictions for this procedure.  Your physician has recommended that you wear an event monitor. Event monitors are medical  devices that record the heart's electrical activity. Doctors most often [place] these monitors to diagnose arrhythmias. Arrhythmias are problems with the speed or rhythm of the heartbeat. The monitor is a small, portable device. You can wear one while you do your normal daily activities. This is usually used to diagnose what is causing palpitations/syncope (passing out).   Follow-Up: Your physician recommends that you schedule a follow-up appointment as needed with Dr. Tamala Julian.    Any Other Special Instructions Will Be Listed Below (If Applicable)      Signed, Sinclair Grooms, MD  08/06/2018 10:52 AM    [place]

## 2018-08-06 NOTE — Patient Instructions (Signed)
Medication Instructions:  Your physician recommends that you continue on your current medications as directed. Please refer to the Current Medication list given to you today.  If you need a refill on your cardiac medications before your next appointment, please call your pharmacy.   Lab work: None If you have labs (blood work) drawn today and your tests are completely normal, you will receive your results only by: Marland Kitchen MyChart Message (if you have MyChart) OR . A paper copy in the mail If you have any lab test that is abnormal or we need to change your treatment, we will call you to review the results.  Testing/Procedures: Your physician has requested that you have an echocardiogram. Echocardiography is a painless test that uses sound waves to create images of your heart. It provides your doctor with information about the size and shape of your heart and how well your heart's chambers and valves are working. This procedure takes approximately one hour. There are no restrictions for this procedure.  Your physician has recommended that you wear an event monitor. Event monitors are medical devices that record the heart's electrical activity. Doctors most often [place] these monitors to diagnose arrhythmias. Arrhythmias are problems with the speed or rhythm of the heartbeat. The monitor is a small, portable device. You can wear one while you do your normal daily activities. This is usually used to diagnose what is causing palpitations/syncope (passing out).   Follow-Up: Your physician recommends that you schedule a follow-up appointment as needed with Dr. Tamala Julian.    Any Other Special Instructions Will Be Listed Below (If Applicable)

## 2018-08-16 ENCOUNTER — Other Ambulatory Visit: Payer: Self-pay | Admitting: Interventional Cardiology

## 2018-08-16 ENCOUNTER — Ambulatory Visit (HOSPITAL_COMMUNITY): Payer: 59 | Attending: Interventional Cardiology

## 2018-08-16 ENCOUNTER — Other Ambulatory Visit: Payer: Self-pay

## 2018-08-16 ENCOUNTER — Ambulatory Visit (INDEPENDENT_AMBULATORY_CARE_PROVIDER_SITE_OTHER): Payer: 59

## 2018-08-16 DIAGNOSIS — I259 Chronic ischemic heart disease, unspecified: Secondary | ICD-10-CM

## 2018-08-16 DIAGNOSIS — R079 Chest pain, unspecified: Secondary | ICD-10-CM

## 2018-08-16 DIAGNOSIS — R002 Palpitations: Secondary | ICD-10-CM

## 2018-08-19 ENCOUNTER — Telehealth: Payer: Self-pay | Admitting: Interventional Cardiology

## 2018-08-19 NOTE — Telephone Encounter (Signed)
° ° °  [employer] calling with critical EKG result

## 2018-08-19 NOTE — Telephone Encounter (Signed)
Received critical alert from [employer].  They are reporting pt activated symptom of passing out this morning at 9:38 am central time.  They report monitor shows NSR at occurrence but have been unable to reach patient via telephone.  lmtcb to discuss w/ pt.

## 2018-08-31 ENCOUNTER — Ambulatory Visit: Payer: 59 | Admitting: Interventional Cardiology

## 2018-12-17 ENCOUNTER — Telehealth: Payer: Self-pay | Admitting: *Deleted

## 2018-12-17 DIAGNOSIS — E785 Hyperlipidemia, unspecified: Secondary | ICD-10-CM

## 2018-12-17 MED ORDER — ROSUVASTATIN CALCIUM 40 MG PO TABS: 40.0000 mg | ORAL_TABLET | Freq: Every day | ORAL | 3 refills | Status: DC

## 2018-12-17 NOTE — Telephone Encounter (Signed)
Spoke with pt and went over recommendations per Dr. Smith.  Pt verbalized understanding and was in agreement with this plan.   

## 2018-12-17 NOTE — Telephone Encounter (Signed)
-----   Message from Belva Crome, MD sent at 12/17/2018 10:38 AM EDT ----- Let the patient know lipids suggest heterozygous hypercholesterolemia. Given family history needs aggressive lipid powering with high intensity statin. Recommend Rosuvastatin 40 mg daily. Lipid panel with liver, hsCRP, and LP(a), in 6 weeks. May require therapy of TG but will wait to reassess after statin. A copy will be sent to Linda Hedges, DO

## 2019-01-30 IMAGING — US US THYROID BIOPSY
1 series · 12 of 25 positions shown · non-contrast
Comparison: Thyroid ultrasound dated 10/31/2016

MEDICATIONS:
None

COMPLICATIONS:
None immediate.

INDICATION: Patient with history of thyromegaly and follow-up thyroid ultrasound
on 10/31/2016 which revealed multiple thyroid nodules, largest of
which are in the isthmus, right of midline measuring 1.3 cm and left
inferior lobe measuring 1.8 cm. Request now received for needle
aspirate biopsies of both the isthmus and left inferior lobe thyroid
nodules.

EXAM:
ULTRASOUND GUIDED FINE NEEDLE ASPIRATION BIOPSIES OF ISTHMUS (RIGHT
OF MIDLINE) AND LEFT INFERIOR THYROID LOBE NODULES
TECHNIQUE: Informed written consent was obtained from the patient after a
discussion of the risks, benefits and alternatives to treatment.
Questions regarding the procedure were encouraged and answered. A
timeout was performed prior to the initiation of the procedure.

[Series 1: us thyroid biopsy · 0.05mm/px · 31 acquisitions, 12 frames shown]
[im 2/31]
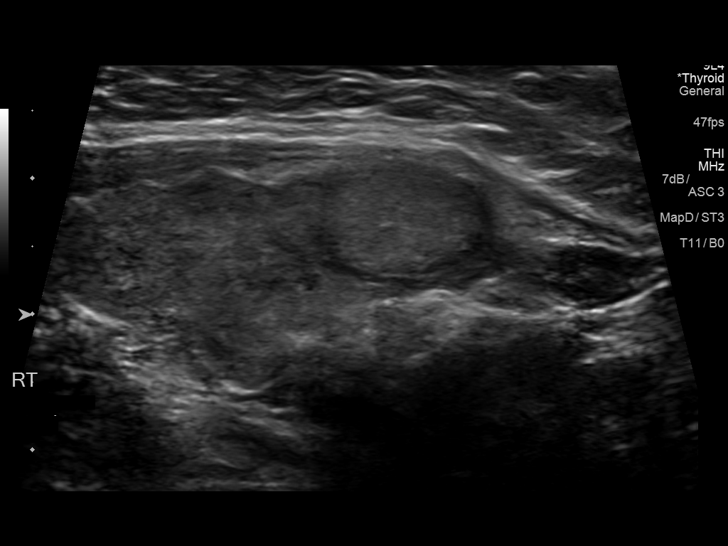
[im 4/31]
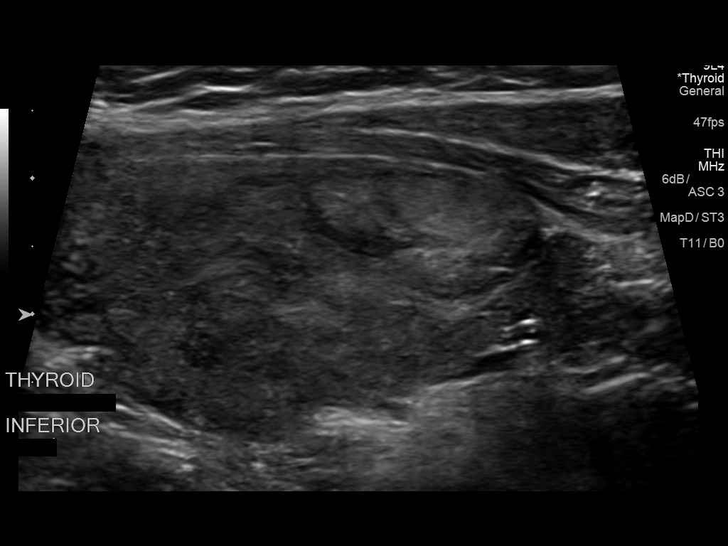
[im 7/31]
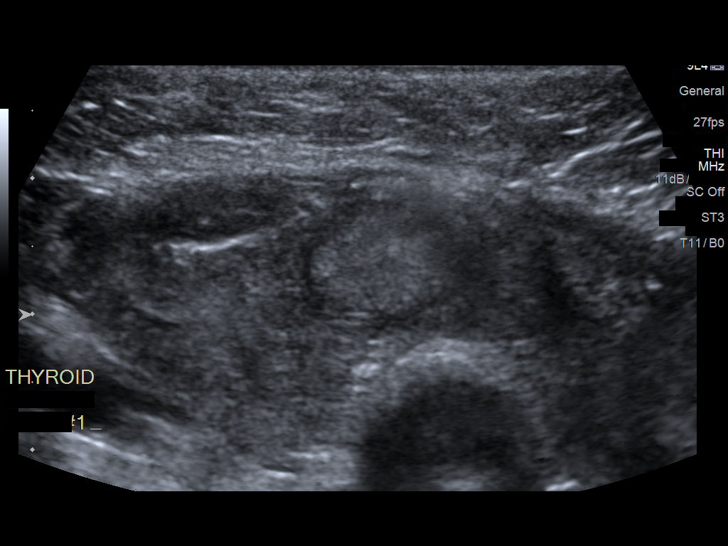
[im 9/31]
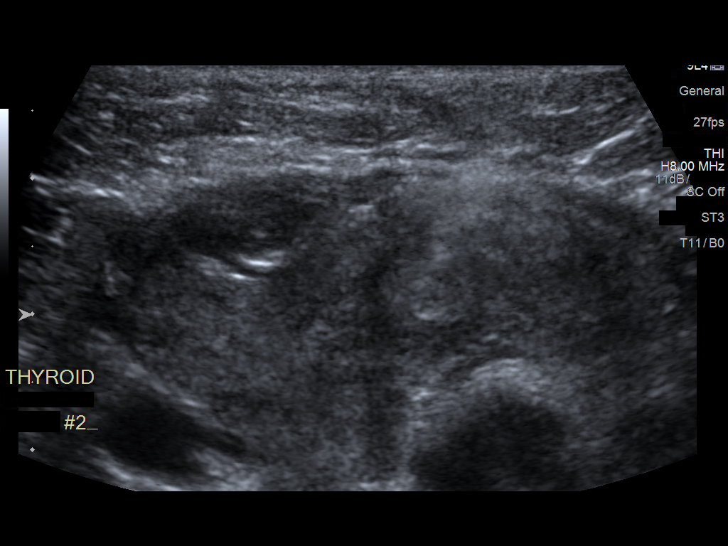
[im 12/31]
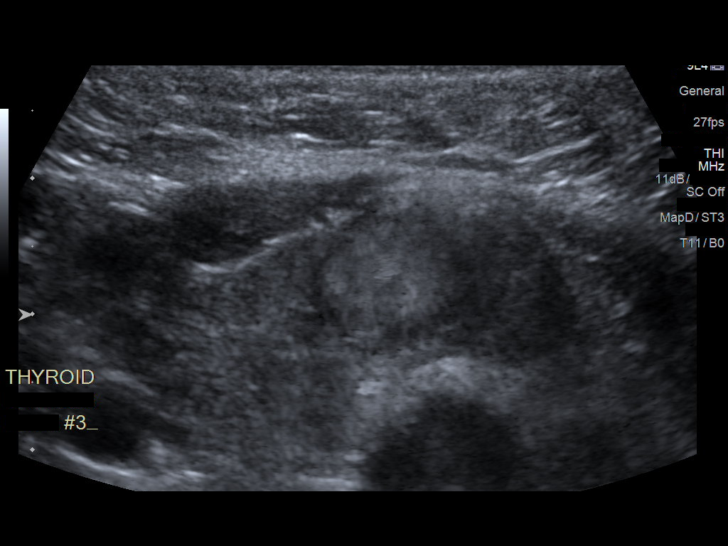
[im 14/31]
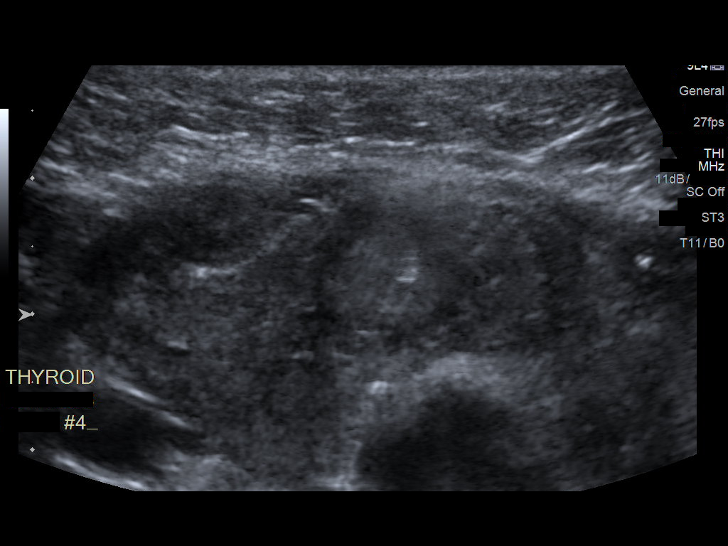
[im 17/31]
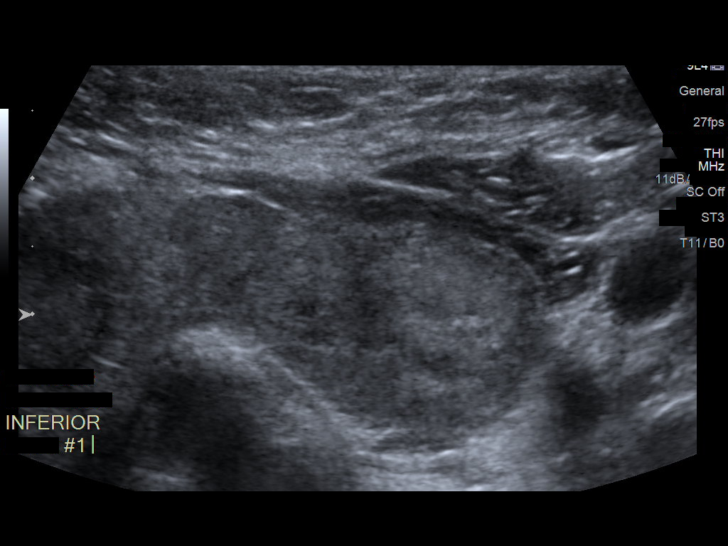
[im 19/31]
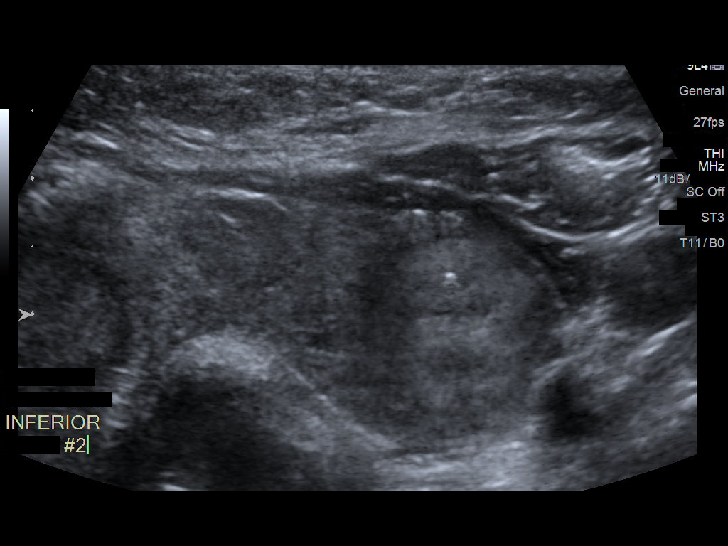
[im 22/31]
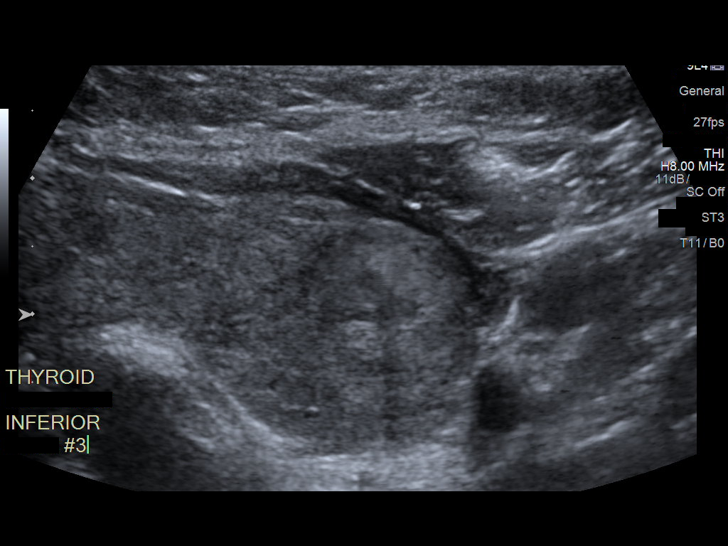
[im 24/31]
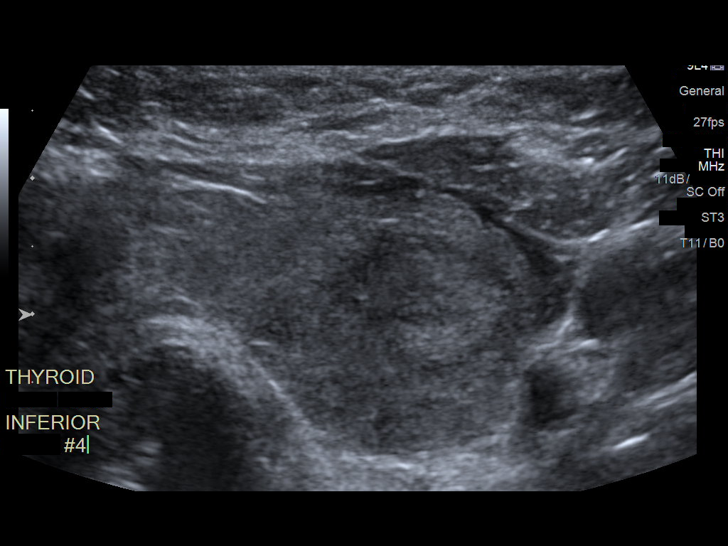
[im 27/31]
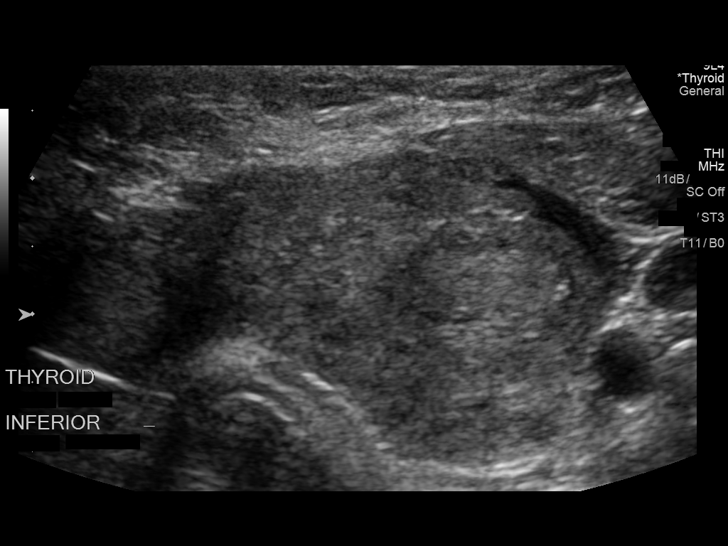
[im 29/31]
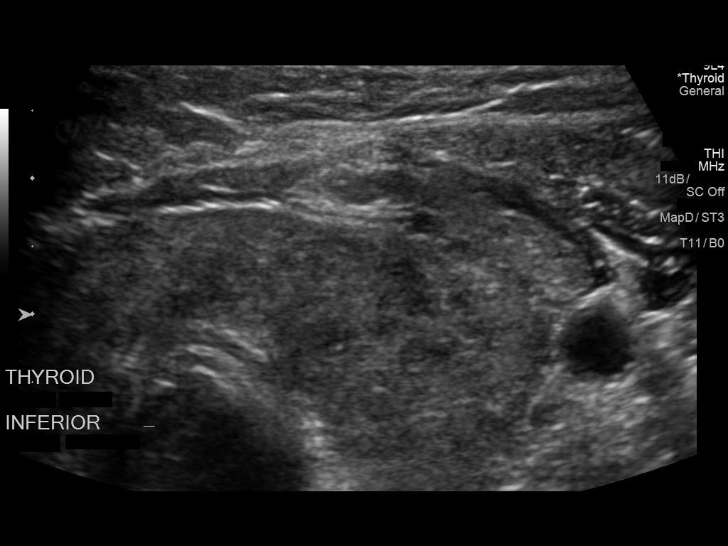

[12 of 25 positions shown; findings below may reference images not displayed]

Pre-procedural ultrasound scanning demonstrated unchanged size and
appearance of the indeterminate nodules within the isthmus and left
inferior thyroid lobe

The procedure was planned. The neck was prepped in the usual sterile
fashion, and a sterile drape was applied covering the operative
field. A timeout was performed prior to the initiation of the
procedure. Local anesthesia was provided with 1% lidocaine.

Under direct ultrasound guidance, 5 FNA biopsies were performed of
the right of midline isthmus thyroid nodule with 25 gauge needles.
Multiple ultrasound images were saved for procedural documentation
purposes. The samples were prepared and submitted to pathology as
well as for Afirma testing.

Under direct ultrasound guidance, 5 FNA biopsies were performed of
the left inferior thyroid lobe nodule with 25 gauge needles.
Multiple ultrasound images were saved for procedural documentation
purposes. The samples were prepared and submitted to pathology as
well as for Afirma testing.

Limited post procedural scanning was negative for hematoma or
additional complication. Dressings were placed. The patient
tolerated the above procedures procedure well without immediate
postprocedural complication.
FINDINGS: FINDINGS
Nodule reference number based on prior diagnostic ultrasound: 1

Maximum size:  1.3 cm

Location: Isthmus; Mid

ACR TI-RADS risk category: TR3 (3 points)

Reason for biopsy: patient/referrer request

_________________________________________________________

Nodule reference number based on prior diagnostic ultrasound: 2

Maximum size:  1.8 cm

Location: Left; Inferior

ACR TI-RADS risk category: TR3 (3 points)

Reason for biopsy: patient/referrer request

Ultrasound imaging confirms appropriate placement of the needles
within the thyroid nodules
IMPRESSION: 1. Technically successful ultrasound guided fine needle aspiration
biopsy of right of midline isthmus thyroid nodule
2. Technically successful ultrasound guided fine needle aspiration
biopsy of left inferior thyroid lobe nodule. Final pathology
pending.

## 2019-02-01 ENCOUNTER — Other Ambulatory Visit: Payer: 59

## 2019-03-30 ENCOUNTER — Ambulatory Visit (INDEPENDENT_AMBULATORY_CARE_PROVIDER_SITE_OTHER): Payer: 59 | Admitting: Orthopaedic Surgery

## 2019-03-30 ENCOUNTER — Encounter: Payer: Self-pay | Admitting: Orthopaedic Surgery

## 2019-03-30 ENCOUNTER — Ambulatory Visit: Payer: Self-pay

## 2019-03-30 VITALS — BP 106/62 | HR 67 | Ht 70.0 in | Wt 170.0 lb

## 2019-03-30 DIAGNOSIS — G8929 Other chronic pain: Secondary | ICD-10-CM | POA: Diagnosis not present

## 2019-03-30 DIAGNOSIS — M47816 Spondylosis without myelopathy or radiculopathy, lumbar region: Secondary | ICD-10-CM

## 2019-03-30 DIAGNOSIS — M545 Low back pain: Secondary | ICD-10-CM

## 2019-03-30 MED ORDER — PREDNISONE 10 MG (21) PO TBPK: ORAL_TABLET | ORAL | 0 refills | Status: DC

## 2019-03-30 NOTE — Progress Notes (Signed)
Office Visit Note   Patient: Sydney Stephens           Date of Birth: [birth year]           MRN: [id number] Visit Date: 03/30/2019              Requested by: Linda Hedges, [place], [place], [place],  [place] [address] PCP: Linda Hedges, DO   Assessment & Plan: Visit Diagnoses:  1. Chronic bilateral low back pain, unspecified whether sciatica present   2. Lumbar facet arthropathy     Plan: Lumbar facet degeneration with retrolisthesis L2-3 L34.  We reviewed x-rays from 9 years ago as well as 1 and 4 years ago.  She has progressive disc degeneration with facet arthropathy and likely has some disc bulging having her radicular symptoms.  Prednisone 10 mg number 21 tablets and then she can take 2 tablets daily for 3 days then 1 tablet daily with food for 5 days.  She has persistent symptoms she will call and we can proceed with MRI imaging.  She will return if she does not respond to treatment.  Follow-Up Instructions: Return if symptoms worsen or fail to improve.   Orders:  Orders Placed This Encounter  Procedures  . XR Lumbar Spine 2-3 Views   Meds ordered this encounter  Medications  . predniSONE (STERAPRED UNI-PAK 21 TAB) 10 MG (21) TBPK tablet    Sig: Take 2 tablets daily with food times 3 days then one tablet daily for4 days.    Dispense:  21 tablet    Refill:  0      Procedures: No procedures performed   Clinical Data: No additional findings.   Subjective: Chief Complaint  Patient presents with  . Lower Back - Pain    HPI 48 year old female seen with recurrent severe back pain right lumbar region radiating to the buttocks without numbness or tingling.  I follow her for over 10 years originally had on-the-job injury previous MRI more than 10 years ago.  She has been treated with prednisone Dosepaks in the past with resolution of symptoms and in the interval between her problems she is done well.  Last seen July 2019 for  similar problem.  Patient states she has had lumbar spasms is only comfortable in the supine position.  Pains over the right SI joint no associated bowel bladder symptoms no chills or fever.  She is used Biofreeze ibuprofen has not gotten relief.  Review of Systems previous thyroidectomy with on vocal cord paralysis.  Positive for GERD lumbar disc degeneration.  Previous smoker.  No weight loss no chills or fever no chest pain.   Objective: Vital Signs: BP 106/62   Pulse 67   Ht 5\' 10"  (1.778 m)   Wt 170 lb (77.1 kg)   BMI 24.39 kg/m   Physical Exam Constitutional:      Appearance: She is well-developed.  HENT:     Head: Normocephalic.     Right Ear: External ear normal.     Left Ear: External ear normal.  Eyes:     Pupils: Pupils are equal, round, and reactive to light.  Neck:     Thyroid: No thyromegaly.     Trachea: No tracheal deviation.  Cardiovascular:     Rate and Rhythm: Normal rate.  Pulmonary:     Effort: Pulmonary effort is normal.  Abdominal:     Palpations: Abdomen is soft.  Skin:  General: Skin is warm and dry.  Neurological:     Mental Status: She is alert and oriented to person, place, and time.  Psychiatric:        Behavior: Behavior normal.     Ortho Exam patient has pain with straight leg raising right side positive sciatic notch tenderness tenderness over the right SI joint.  Knee and ankle jerk are 1+ and symmetrical.  Anterior tib gastrocsoleus is intact.  Specialty Comments:  No specialty comments available.  Imaging: Xr Lumbar Spine 2-3 Views  Result Date: 03/30/2019 AP lateral lumbar x-rays are obtained and reviewed.  This shows mild lumbar curvature left less than 15 degrees.  2 mm retrolisthesis at L2-3 and L3-4.  Multilevel Schmorl's nodes.  Sacroiliac joints and portion of the pelvis visualized are normal. Impression.  Lumbar disc degeneration with facet arthropathy and slight retrolisthesis L2-3 L3-4.    PMFS History: Patient Active  Problem List   Diagnosis Date Noted  . Lumbar facet arthropathy 03/30/2019  . Headache 09/29/2017  . 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 GERD (gastroesophageal reflux disease)   . Hypothyroidism   . PONV (postoperative nausea and vomiting)     Family History  Problem Relation Age of Onset  . Diabetes Mother   . COPD Mother   . High blood pressure Mother   . Prostate cancer Father   . Asthma Brother     Past Surgical History:  Procedure Laterality Date  . BREAST LUMPECTOMY    . LAPAROTOMY    . THYROIDECTOMY N/A 01/16/2017   Procedure: THYROIDECTOMY;  Surgeon: Izora Gala, MD;  Location: [facility];  Service: ENT;  Laterality: N/A;  RNFA ([facility])  . TONSILLECTOMY    . wisdom teeth     Social History   Occupational History  . Not on file  Tobacco Use  . Smoking status: Former Smoker    Types: Cigarettes    Quit date: 2012    Years since quitting: 8.6  . Smokeless tobacco: Never Used  Substance and Sexual Activity  . Alcohol use: Yes    Comment: occ  . Drug use: No  . Sexual activity: Yes

## 2019-04-18 ENCOUNTER — Encounter (HOSPITAL_COMMUNITY): Payer: Self-pay

## 2019-04-18 ENCOUNTER — Emergency Department (HOSPITAL_COMMUNITY)
Admission: EM | Admit: 2019-04-18 | Discharge: 2019-04-18 | Disposition: A | Discharge status: Home / Self Care | Payer: 59 | Attending: Emergency Medicine | Admitting: Emergency Medicine

## 2019-04-18 ENCOUNTER — Other Ambulatory Visit: Payer: Self-pay

## 2019-04-18 ENCOUNTER — Emergency Department (HOSPITAL_COMMUNITY): Payer: 59

## 2019-04-18 DIAGNOSIS — F419 Anxiety disorder, unspecified: Secondary | ICD-10-CM | POA: Insufficient documentation

## 2019-04-18 DIAGNOSIS — E039 Hypothyroidism, unspecified: Secondary | ICD-10-CM | POA: Diagnosis not present

## 2019-04-18 DIAGNOSIS — Z87891 Personal history of nicotine dependence: Secondary | ICD-10-CM | POA: Insufficient documentation

## 2019-04-18 DIAGNOSIS — R0789 Other chest pain: Secondary | ICD-10-CM | POA: Diagnosis present

## 2019-04-18 DIAGNOSIS — R110 Nausea: Secondary | ICD-10-CM | POA: Insufficient documentation

## 2019-04-18 LAB — I-STAT BETA HCG BLOOD, ED (MC, WL, AP ONLY): I-stat hCG, quantitative: <5 m[IU]/mL (ref <5)

## 2019-04-18 LAB — CBC
HCT: 40.9 % (ref 36.0–46.0)
Hemoglobin: 13.1 g/dL (ref 12.0–15.0)
MCH: 31.3 pg (ref 26.0–34.0)
MCHC: 32 g/dL (ref 30.0–36.0)
MCV: 97.6 fL (ref 80.0–100.0)
Platelets: 263 10*3/uL (ref 150–400)
RBC: 4.19 MIL/uL (ref 3.87–5.11)
RDW: 12.2 % (ref 11.5–15.5)
WBC: 6.2 10*3/uL (ref 4.0–10.5)
nRBC: 0 % (ref 0.0–0.2)

## 2019-04-18 LAB — BASIC METABOLIC PANEL
Anion gap: 11 (ref 5–15)
BUN: 14 mg/dL (ref 6–20)
CO2: 25 mmol/L (ref 22–32)
Calcium: 9 mg/dL (ref 8.9–10.3)
Chloride: 106 mmol/L (ref 98–111)
Creatinine, Ser: 0.77 mg/dL (ref 0.44–1.00)
GFR calc Af Amer: >60 mL/min (ref >60)
GFR calc non Af Amer: >60 mL/min (ref >60)
Glucose, Bld: 94 mg/dL (ref 70–99)
Potassium: 3.9 mmol/L (ref 3.5–5.1)
Sodium: 142 mmol/L (ref 135–145)

## 2019-04-18 LAB — TROPONIN I (HIGH SENSITIVITY): Troponin I (High Sensitivity): <2 ng/L (ref <18)

## 2019-04-18 MED ORDER — FLUOXETINE HCL 10 MG PO TABS: 10.0000 mg | ORAL_TABLET | Freq: Every day | ORAL | 1 refills | Status: AC

## 2019-04-18 NOTE — ED Provider Notes (Signed)
[facility] EMERGENCY DEPARTMENT Provider Note   CSN: [id number] Arrival date & time: 04/18/19  0736     History   Chief Complaint Chief Complaint  Patient presents with  . Chest Pain    HPI Sydney Stephens is a 48 y.o. female past medical history significant for hyperthyroidism status post thyroidectomy who presents to the ER for a 2-day history of progressively more frequent episodes of mid-chest discomfort with associated nausea, diaphoresis, and radiation to left arm. She describes the pain as a 4/10 and the episodes last approximately 10 seconds in duration. Non-exertional. No aggravating or relieving factors. Patient recently began taking statin for hyperlipidemia, but denies any other personal cardiac history.  Patient does admit to a family history of early cardiac death.  Patient denies any fevers, chills, recent illness, cough, shortness of breath, abdominal pain, leg swelling, immobilization, recent trauma or increased physical activity, vomiting, dizziness, or syncope. Patient denies current tobacco use.  Patient admits to significantly increased stress as of late, predominantly from her job [employer], and feels as though that could be contributing to her symptoms. Patient had spoken with a therapist recently who offered anti-anxiety medication to which she declined.      HPI  Past Medical History:  Diagnosis Date  . GERD (gastroesophageal reflux disease)   . Hypothyroidism   . PONV (postoperative nausea and vomiting)     Patient Active Problem List   Diagnosis Date Noted  . Lumbar facet arthropathy 03/30/2019  . Headache 09/29/2017  . 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 BREAST LUMPECTOMY    . LAPAROTOMY    . THYROIDECTOMY N/A 01/16/2017   Procedure: THYROIDECTOMY;  Surgeon: Izora Gala, MD;  Location: [facility];  Service: ENT;  Laterality: N/A;  RNFA ([facility])  . TONSILLECTOMY     . wisdom teeth       OB History   No obstetric history on file.      Home Medications    Prior to Admission medications   Medication Sig Start Date End Date Taking? Authorizing Provider  Cholecalciferol (VITAMIN D3 SUPER STRENGTH) 50 MCG (2000 UT) CAPS Take 1 capsule by mouth daily.    [provider]  Diclofenac Sodium 2 % SOLN Place 2 g onto the skin 2 (two) times daily. 09/29/17   Lyndal Pulley, DO  diphenhydrAMINE (BENADRYL) 25 MG tablet Take 25 mg by mouth at bedtime.    [provider]  docusate sodium (COLACE) 100 MG capsule Take 300 mg by mouth daily.    [provider]  famotidine (PEPCID) 20 MG tablet Take 20 mg by mouth 2 (two) times daily.    [provider]  FLUoxetine (PROZAC) 10 MG tablet Take 1 tablet (10 mg total) by mouth daily. 04/18/19   Corena Herter, PA-C  levothyroxine (SYNTHROID, LEVOTHROID) 125 MCG tablet Take 125 mcg by mouth daily.  11/26/16   [provider]  loratadine (CLARITIN) 10 MG tablet Take 10 mg by mouth daily.    [provider]  Magnesium Oxide 500 MG CAPS Take 1,000 mg by mouth daily.    [provider]  Magnesium Oxide, Antacid, 500 MG CAPS Take by mouth.    [provider]  predniSONE (STERAPRED UNI-PAK 21 TAB) 10 MG (21) TBPK tablet Take 2 tablets daily with food times 3 days then one tablet daily for4 days. 03/30/19   Marybelle Killings, MD  rosuvastatin (  CRESTOR) 40 MG tablet Take 1 tablet (40 mg total) by mouth daily. 12/17/18   Belva Crome, MD  tetrahydrozoline-zinc (VISINE-AC) 0.05-0.25 % ophthalmic solution Place 1 drop into both eyes daily.    [provider]    Family History Family History  Problem Relation Age of Onset  . Diabetes Mother   . COPD Mother   . High blood pressure Mother   . Prostate cancer Father   . Asthma Brother     Social History Social History   Tobacco Use  . Smoking status: Former Smoker    Types: Cigarettes    Quit  date: 2012    Years since quitting: 8.7  . Smokeless tobacco: Never Used  Substance Use Topics  . Alcohol use: Yes    Comment: occ  . Drug use: No     Allergies   No known allergies   Review of Systems Review of Systems Ten systems are reviewed and are negative for acute change except as noted in the HPI   Physical Exam Updated Vital Signs BP 122/63 (BP Location: Right Arm)   Pulse 76   Temp 98.5 F (36.9 C) (Oral)   Resp 16   Ht 5\' 10"  (1.778 m)   Wt 77.1 kg   LMP 04/17/2016 (Approximate)   SpO2 100%   BMI 24.39 kg/m   Physical Exam Constitutional:      Appearance: Normal appearance.  HENT:     Head: Normocephalic and atraumatic.  Eyes:     General: No scleral icterus.    Conjunctiva/sclera: Conjunctivae normal.  Cardiovascular:     Rate and Rhythm: Normal rate and regular rhythm.     Pulses: Normal pulses.     Heart sounds: Normal heart sounds.  Pulmonary:     Effort: Pulmonary effort is normal.     Breath sounds: Normal breath sounds.  Abdominal:     General: Bowel sounds are normal.     Palpations: Abdomen is soft.  Musculoskeletal:     Comments: Chest pain not reproducible with palpation.  No lower leg swelling, erythema, or discomfort with palpation.  Neurological:     General: No focal deficit present.     Mental Status: She is alert and oriented to person, place, and time.     GCS: GCS eye subscore is 4. GCS verbal subscore is 5. GCS motor subscore is 6.  Psychiatric:        Mood and Affect: Mood normal.        Behavior: Behavior normal.        Thought Content: Thought content normal.      ED Treatments / Results  Labs (all labs ordered are listed, but only abnormal results are displayed) Labs Reviewed  BASIC METABOLIC PANEL  CBC  I-STAT BETA HCG BLOOD, ED ([facility], [facility], [facility] ONLY)  TROPONIN I (HIGH SENSITIVITY)  TROPONIN I (HIGH SENSITIVITY)    EKG EKG Interpretation  Date/Time:  Monday April 18 2019 07:46:24 EDT Ventricular Rate:   74 PR Interval:  116 QRS Duration: 90 QT Interval:  374 QTC Calculation: 415 R Axis:   76 Text Interpretation:  Normal sinus rhythm Nonspecific ST and T wave abnormality Abnormal ECG since last tracing no significant change Confirmed by Noemi Chapel [phone]) on 04/18/2019 8:12:21 AM   Radiology Dg Chest 2 View  Result Date: 04/18/2019 CLINICAL DATA:  Chest pain. EXAM: CHEST - 2 VIEW COMPARISON:  Radiographs of July 23, 2018. FINDINGS: The heart size and mediastinal contours are within  normal limits. Both lungs are clear. No pneumothorax or pleural effusion is noted. The visualized skeletal structures are unremarkable. IMPRESSION: No active cardiopulmonary disease. Electronically Signed   By: Marijo Conception M.D.   On: 04/18/2019 08:26    Procedures Procedures (including critical care time)  Medications Ordered in ED Medications - No data to display   Initial Impression / Assessment and Plan / ED Course  I have reviewed the triage vital signs and the nursing notes.  Pertinent labs & imaging results that were available during my care of the patient were reviewed by me and considered in my medical decision making (see chart for details).       Patient's EKG is unchanged from previous EKG. Troponin came back normal.  Chest x-ray was interpreted and is reassuring. Does not appear to be STEMI or NSTEMI.   Patient denies any clotting history, recent immobilization, leg pain or swelling, current smoking use, hormone therapy, or other risk factors for pulmonary embolism.  Her vitals are normal and she is oxygenating well on room air. CT not indicated at this point.   No radiation to back and low suspicion for dissection. Patient admits to high stress as of late and these are most likely attributable to anxiety. No overwhelming feelings of doom so do not suspect panic disorder. Will start patient on fluoxetine for her symptoms. Instructed that patient follow-up with PCP for ongoing  management of her anxiety medication.  Informed her that it will take a while before the fluoxetine takes effect.  Patient is aware of possible side effects that she had taken a different SSRI, sertraline, years ago. Patient voiced understanding and is agreeable to plan.    Final Clinical Impressions(s) / ED Diagnoses   Final diagnoses:  Anxiety  Atypical chest pain    ED Discharge Orders         Ordered    FLUoxetine (PROZAC) 10 MG tablet  Daily     04/18/19 0950           Corena Herter, PA-C 04/18/19 1026    Noemi Chapel, MD 04/19/19 [phone]

## 2019-04-18 NOTE — ED Triage Notes (Signed)
Pt endorses intermittent left sided chest pain with radiation to the left shoulder/arm with shob, nausea, diaphoresis x 2 days. No cardiac hx

## 2019-04-18 NOTE — ED Provider Notes (Signed)
Medical screening examination/treatment/procedure(s) were conducted as a shared visit with non-physician practitioner(s) and myself.  I personally evaluated the patient during the encounter.  Clinical Impression:   Final diagnoses:  Anxiety  Atypical chest pain   This patient is a very pleasant 48 year old female who presents with a complaint of some chest discomfort, seems to be around the mid chest to the left chest, radiates to the left arm, she has some intermittent shortness of breath intermittent diaphoresis and states this comes on at rest.  It last for between 7 and 10 seconds and then completely resolves.  It seems to be associated with increasing levels of stress and anxiety related to her job.  She is sitting for 10 to 12 hours a day and feels very stressed and anxious.  She is feeling overworked.  She has recently spoken with a therapist about the anxiety.  She does not have a family doctor.  On exam the patient appears mildly anxious but has clear heart and lung sounds without murmurs rubs or gallops, clear lungs, soft abdomen, no edema, no JVD, normal peripheral pulses.  Her EKG is nonischemic and totally unchanged from prior EKG, labs pending, chest x-ray pending.  The patient is otherwise well-appearing and I doubt that this is cardiac in nature.  She likely would benefit from initiating some antianxiety medication and referral to a family doctor.   EKG Interpretation  Date/Time:  Monday April 18 2019 07:46:24 EDT Ventricular Rate:  74 PR Interval:  116 QRS Duration: 90 QT Interval:  374 QTC Calculation: 415 R Axis:   76 Text Interpretation:  Normal sinus rhythm Nonspecific ST and T wave abnormality Abnormal ECG since last tracing no significant change Confirmed by Noemi Chapel [phone]) on 04/18/2019 8:12:21 AM Also confirmed by Noemi Chapel [phone]), editor Hattie Perch ([id number])  on 04/18/2019 10:34:01 AM      CXR has been seen and interpreted by myself - there is  no infiltrate or pneumothorax - normal mediastinum.  Labs negative  Pt reassuring well appearing, will start sertraline - encourage PCP f/u.     Noemi Chapel, MD 04/19/19 [phone]

## 2019-04-18 NOTE — Discharge Instructions (Signed)
Please review attachment.  Please get established with a PCP so he can have your anxiety monitored moving forward and make appropriate medication or dose changes.  Be sure to return here in 2 weeks with your OB/GYN.   [facility] phone number is [phone].

## 2019-08-08 ENCOUNTER — Telehealth: Payer: Self-pay | Admitting: Orthopaedic Surgery

## 2019-08-08 NOTE — Telephone Encounter (Signed)
Patient called. She would like a refill on prednisone called in. Her call back number is [phone]

## 2019-08-08 NOTE — Telephone Encounter (Signed)
Please advise 

## 2019-08-09 ENCOUNTER — Other Ambulatory Visit: Payer: Self-pay

## 2019-08-09 MED ORDER — PREDNISONE 10 MG (21) PO TBPK: ORAL_TABLET | ORAL | 0 refills | Status: DC

## 2019-08-09 NOTE — Telephone Encounter (Signed)
OK refill thanks 

## 2019-08-09 NOTE — Telephone Encounter (Signed)
Sent in to pharmacy.  

## 2019-09-07 ENCOUNTER — Other Ambulatory Visit: Payer: Self-pay | Admitting: Cardiology

## 2019-09-16 NOTE — Progress Notes (Deleted)
Cardiology Office Note   Date:  09/16/2019   ID:  Sydney Stephens, DOB [birth year], MRN [id number]  PCP:  Linda Hedges, DO  Cardiologist: Dr. Tamala Julian, MD   No chief complaint on file.   History of Present Illness: Sydney Stephens is a 49 y.o. female who presents for follow up, seen for Dr. Tamala Julian.   Sydney Stephens has hx of gastroesophageal reflux and hypothyroidism referred by Dr. Seward Carol MD and [facility] for chest pressure and palpitations following an ED visit in 2020.  Sydney Stephens began having palpitations 2 weeks prior to initial referral with HRs in the 180-190 range. On one particular episode, she was seen at [facility] at which time her workup was found to be negative. She was then seen by Dr. Tamala Julian 08/06/2018 after the ED visit with complaints of palpitations. She denies chest pain and syncope. She wore a cardiac monitor which showed HR's which would spike into the 180 range at times. The index event was associated with heart racing, device indicator of heart rate 190, lasting 20 minutes.  Plan was for an echocardiogram and 30 day monitor to exclude PSVT or evidence of accessory pathway.  May also need to consider the impact of thyroid replacement therapy.  Fortunately, echocardiogram performed 08/16/2018 with normal EF and no wall motion abnormalities and no valvular disease.  30-day monitor completed 08/16/2018 with NSR, sinus tachycardia with no pauses, SVT, AF or bradycardia.  She has also had sharp fleeting chest pain that lasts seconds.  Very focal and without features of ischemia.  No exertional component.    1. Possible PSVT: -Pt   2. Fleeting chest pain: -Felt not to need further evaluation per Dr. Tamala Julian. Symptoms felt to be very atypical. Plan was for further work up if abnormality on echo.    Past Medical History:  Diagnosis Date  . GERD (gastroesophageal reflux disease)   . Hypothyroidism   . PONV (postoperative nausea and vomiting)      Past Surgical History:  Procedure Laterality Date  . BREAST LUMPECTOMY    . LAPAROTOMY    . THYROIDECTOMY N/A 01/16/2017   Procedure: THYROIDECTOMY;  Surgeon: Izora Gala, MD;  Location: [facility];  Service: ENT;  Laterality: N/A;  RNFA ([place])  . TONSILLECTOMY    . wisdom teeth       Current Outpatient Medications  Medication Sig Dispense Refill  . Cholecalciferol (VITAMIN D3 SUPER STRENGTH) 50 MCG (2000 UT) CAPS Take 1 capsule by mouth daily.    . Diclofenac Sodium 2 % SOLN Place 2 g onto the skin 2 (two) times daily. 112 g 3  . diphenhydrAMINE (BENADRYL) 25 MG tablet Take 25 mg by mouth at bedtime.    . docusate sodium (COLACE) 100 MG capsule Take 300 mg by mouth daily.    . famotidine (PEPCID) 20 MG tablet Take 20 mg by mouth 2 (two) times daily.    Marland Kitchen FLUoxetine (PROZAC) 10 MG tablet Take 1 tablet (10 mg total) by mouth daily. 30 tablet 1  . levothyroxine (SYNTHROID, LEVOTHROID) 125 MCG tablet Take 125 mcg by mouth daily.     Marland Kitchen loratadine (CLARITIN) 10 MG tablet Take 10 mg by mouth daily.    . Magnesium Oxide 500 MG CAPS Take 1,000 mg by mouth daily.    . Magnesium Oxide, Antacid, 500 MG CAPS Take by mouth.    . predniSONE (STERAPRED UNI-PAK 21 TAB) 10 MG (21) TBPK tablet Take 2 tablets  daily with food times 3 days then one tablet daily for4 days. 21 tablet 0  . rosuvastatin (CRESTOR) 40 MG tablet Take 1 tablet (40 mg total) by mouth daily. 90 tablet 3  . tetrahydrozoline-zinc (VISINE-AC) 0.05-0.25 % ophthalmic solution Place 1 drop into both eyes daily.     No current facility-administered medications for this visit.    Allergies:   No known allergies    Social History:  The patient  reports that she quit smoking about 9 years ago. Her smoking use included cigarettes. She has never used smokeless tobacco. She reports current alcohol use. She reports that she does not use drugs.   Family History:  The patient's ***family history includes Asthma in her brother; COPD in her mother;  Diabetes in her mother; High blood pressure in her mother; Prostate cancer in her father.    ROS:  Please see the history of present illness.   Otherwise, review of systems are positive for {NONE DEFAULTED:18576::"none"}.   All other systems are reviewed and negative.    PHYSICAL EXAM: VS:  There were no vitals taken for this visit. , BMI There is no height or weight on file to calculate BMI. GEN: Well nourished, well developed, in no acute distress HEENT: normal Neck: no JVD, carotid bruits, or masses Cardiac: ***RRR; no murmurs, rubs, or gallops,no edema  Respiratory:  clear to auscultation bilaterally, normal work of breathing GI: soft, nontender, nondistended, + BS MS: no deformity or atrophy Skin: warm and dry, no rash Neuro:  Strength and sensation are intact Psych: euthymic mood, full affect   EKG:  EKG {ACTION; IS/IS [id number] ordered today. The ekg ordered today demonstrates ***   Recent Labs: 04/18/2019: BUN 14; Creatinine, Ser 0.77; Hemoglobin 13.1; Platelets 263; Potassium 3.9; Sodium 142    Lipid Panel No results found for: CHOL, TRIG, HDL, CHOLHDL, VLDL, LDLCALC, LDLDIRECT    Wt Readings from Last 3 Encounters:  04/18/19 170 lb (77.1 kg)  03/30/19 170 lb (77.1 kg)  08/06/18 175 lb 6.4 oz (79.6 kg)    Other studies Reviewed: Additional studies/ records that were reviewed today include:   Echocardiogram 08/16/18: Study Conclusions   - Left ventricle: The cavity size was normal. Wall thickness was  normal. Systolic function was normal. The estimated ejection  fraction was in the range of 55% to 60%. Wall motion was normal;  there were no regional wall motion abnormalities. Left  ventricular diastolic function parameters were normal for the  patient&'s age.   30 day monitor 08/16/2018:   NSR  Sinus rhythm and sinus tachycardia  No pauses, SVT, AF, bradycardia, or pauses   ASSESSMENT AND PLAN:  1.  ***   Current medicines are  reviewed at length with the patient today.  The patient {ACTIONS; HAS/DOES NOT HAVE:19233} concerns regarding medicines.  The following changes have been made:  {PLAN; NO CHANGE:13088:s}  Labs/ tests ordered today include: *** No orders of the defined types were placed in this encounter.    Disposition:   FU with *** in {gen number [id number] {Days to years:10300}  Signed, Kathyrn Drown, NP  09/16/2019 7:31 PM    [facility] [address], [place], [place]  [address] Phone: [phone]; Fax: [phone]

## 2019-09-21 ENCOUNTER — Telehealth: Payer: Self-pay | Admitting: Interventional Cardiology

## 2019-09-21 DIAGNOSIS — E785 Hyperlipidemia, unspecified: Secondary | ICD-10-CM

## 2019-09-21 DIAGNOSIS — I259 Chronic ischemic heart disease, unspecified: Secondary | ICD-10-CM

## 2019-09-21 DIAGNOSIS — R002 Palpitations: Secondary | ICD-10-CM

## 2019-09-21 NOTE — Telephone Encounter (Signed)
Patient is calling requesting orders for lab work prior to her appointment scheduled for 11/10/19 with Dr. Tamala Julian.

## 2019-09-21 NOTE — Telephone Encounter (Signed)
{  Please do those previously ordered.

## 2019-09-21 NOTE — Telephone Encounter (Signed)
Pt had Lipid panel drawn 12/2018 and medication was started.  Pt was told to have Lipid, Liver, hsCRP and LPa 6 weeks later but pt never came to get them drawn. Do you still want to do all of these or have any additional labs you want?  Pt had BMET and CBC 04/2019.

## 2019-09-22 ENCOUNTER — Ambulatory Visit: Payer: 59 | Admitting: Cardiology

## 2019-09-22 NOTE — Telephone Encounter (Signed)
Spoke with pt and scheduled her for labs on 11/08/19.

## 2019-09-22 NOTE — Addendum Note (Signed)
Addended by: Loren Racer on: 09/22/2019 09:28 AM   Modules accepted: Orders

## 2019-11-02 ENCOUNTER — Other Ambulatory Visit: Payer: Self-pay

## 2019-11-02 ENCOUNTER — Emergency Department (HOSPITAL_BASED_OUTPATIENT_CLINIC_OR_DEPARTMENT_OTHER): Payer: 59

## 2019-11-02 ENCOUNTER — Emergency Department (HOSPITAL_BASED_OUTPATIENT_CLINIC_OR_DEPARTMENT_OTHER)
Admission: EM | Admit: 2019-11-02 | Discharge: 2019-11-02 | Disposition: A | Discharge status: Home / Self Care | Payer: 59 | Attending: Emergency Medicine | Admitting: Emergency Medicine

## 2019-11-02 ENCOUNTER — Encounter (HOSPITAL_BASED_OUTPATIENT_CLINIC_OR_DEPARTMENT_OTHER): Payer: Self-pay | Admitting: Emergency Medicine

## 2019-11-02 DIAGNOSIS — R110 Nausea: Secondary | ICD-10-CM | POA: Diagnosis not present

## 2019-11-02 DIAGNOSIS — E039 Hypothyroidism, unspecified: Secondary | ICD-10-CM | POA: Insufficient documentation

## 2019-11-02 DIAGNOSIS — F419 Anxiety disorder, unspecified: Secondary | ICD-10-CM | POA: Insufficient documentation

## 2019-11-02 DIAGNOSIS — R1013 Epigastric pain: Secondary | ICD-10-CM | POA: Insufficient documentation

## 2019-11-02 DIAGNOSIS — R61 Generalized hyperhidrosis: Secondary | ICD-10-CM | POA: Diagnosis not present

## 2019-11-02 DIAGNOSIS — Z79899 Other long term (current) drug therapy: Secondary | ICD-10-CM | POA: Diagnosis not present

## 2019-11-02 DIAGNOSIS — R0789 Other chest pain: Secondary | ICD-10-CM

## 2019-11-02 DIAGNOSIS — F17210 Nicotine dependence, cigarettes, uncomplicated: Secondary | ICD-10-CM | POA: Diagnosis not present

## 2019-11-02 LAB — CBC WITH DIFFERENTIAL/PLATELET
Abs Immature Granulocytes: 0.01 10*3/uL (ref 0.00–0.07)
Basophils Absolute: 0 10*3/uL (ref 0.0–0.1)
Basophils Relative: 1 %
Eosinophils Absolute: 0 10*3/uL (ref 0.0–0.5)
Eosinophils Relative: 1 %
HCT: 37.2 % (ref 36.0–46.0)
Hemoglobin: 11.8 g/dL — ABNORMAL LOW (ref 12.0–15.0)
Immature Granulocytes: 0 %
Lymphocytes Relative: 50 %
Lymphs Abs: 3 10*3/uL (ref 0.7–4.0)
MCH: 30.9 pg (ref 26.0–34.0)
MCHC: 31.7 g/dL (ref 30.0–36.0)
MCV: 97.4 fL (ref 80.0–100.0)
Monocytes Absolute: 0.5 10*3/uL (ref 0.1–1.0)
Monocytes Relative: 8 %
Neutro Abs: 2.4 10*3/uL (ref 1.7–7.7)
Neutrophils Relative %: 40 %
Platelets: 264 10*3/uL (ref 150–400)
RBC: 3.82 MIL/uL — ABNORMAL LOW (ref 3.87–5.11)
RDW: 12.4 % (ref 11.5–15.5)
WBC: 6 10*3/uL (ref 4.0–10.5)
nRBC: 0 % (ref 0.0–0.2)

## 2019-11-02 LAB — COMPREHENSIVE METABOLIC PANEL
ALT: 16 U/L (ref 0–44)
AST: 18 U/L (ref 15–41)
Albumin: 4.2 g/dL (ref 3.5–5.0)
Alkaline Phosphatase: 71 U/L (ref 38–126)
Anion gap: 8 (ref 5–15)
BUN: 19 mg/dL (ref 6–20)
CO2: 27 mmol/L (ref 22–32)
Calcium: 9.1 mg/dL (ref 8.9–10.3)
Chloride: 101 mmol/L (ref 98–111)
Creatinine, Ser: 0.76 mg/dL (ref 0.44–1.00)
GFR calc Af Amer: >60 mL/min (ref >60)
GFR calc non Af Amer: >60 mL/min (ref >60)
Glucose, Bld: 97 mg/dL (ref 70–99)
Potassium: 4.6 mmol/L (ref 3.5–5.1)
Sodium: 136 mmol/L (ref 135–145)
Total Bilirubin: 0.6 mg/dL (ref 0.3–1.2)
Total Protein: 7.6 g/dL (ref 6.5–8.1)

## 2019-11-02 LAB — LIPASE, BLOOD: Lipase: 37 U/L (ref 11–51)

## 2019-11-02 LAB — TROPONIN I (HIGH SENSITIVITY): Troponin I (High Sensitivity): <2 ng/L (ref <18)

## 2019-11-02 LAB — D-DIMER, QUANTITATIVE: D-Dimer, Quant: 0.61 ug/mL-FEU — ABNORMAL HIGH (ref 0.00–0.50)

## 2019-11-02 LAB — TSH: TSH: 1.925 u[IU]/mL (ref 0.350–4.500)

## 2019-11-02 MED ORDER — NAPROXEN 375 MG PO TABS: 375.0000 mg | ORAL_TABLET | Freq: Two times a day (BID) | ORAL | 0 refills | Status: AC

## 2019-11-02 MED ORDER — IOHEXOL 350 MG/ML SOLN
100.0000 mL | Freq: Once | INTRAVENOUS | Status: AC | PRN
  Administered 2019-11-02: 15:00:00 100 mL via INTRAVENOUS

## 2019-11-02 MED ORDER — KETOROLAC TROMETHAMINE 15 MG/ML IJ SOLN
15.0000 mg | Freq: Once | INTRAMUSCULAR | Status: AC
  Administered 2019-11-02: 14:00:00 15 mg via INTRAVENOUS
  Filled 2019-11-02: qty 1

## 2019-11-02 NOTE — Discharge Instructions (Addendum)
Your laboratory results are within normal limits today.  I prescribed a short course of anti-inflammatories, please take 1 tablet twice a day for the next 7 days.  Encouraged to follow-up with your primary care physician at your earliest convenience.

## 2019-11-02 NOTE — ED Triage Notes (Signed)
PT here with generalized chest pain, left leg pain, bilateral arm pain, and  abdominal pain. Pt states there is a family hx of stroke and heart problems and states she has had tachycardia before. Also has tingling in left hand.

## 2019-11-02 NOTE — ED Provider Notes (Signed)
[facility] EMERGENCY DEPARTMENT Provider Note   CSN: [id number] Arrival date & time: 11/02/19  1155     History Chief Complaint  Patient presents with  . Chest Pain  . Multiple complaints    Sydney Stephens is a 49 y.o. female.  49 y.o female with a PMH of GERD, Hypothyroid resents to the ED with a chief complaint of substernal chest pain which began 10 days ago.  Was seen in the hospital in September 2020, was told that this was likely anxiety disorder.  Has taken nitro sublingual from her husband x210 days ago which did not improve some of the symptoms.  Has also taken baby aspirin prior to arrival with improvement in symptoms.  Prior cardiac history, family history of CAD.    Chest Pain Pain location:  Substernal area Pain quality: aching   Pain radiates to:  Epigastrium Duration:  10 days Timing:  Intermittent Progression:  Unchanged Chronicity:  Recurrent Context: stress   Relieved by:  Rest Worsened by:  Certain positions and movement Associated symptoms: anxiety, diaphoresis and nausea   Associated symptoms: no fever, no shortness of breath and no vomiting   Risk factors: high cholesterol and smoking (26 years, stopped 9 years ago)   Risk factors: no birth control, no coronary artery disease, no diabetes mellitus and no hypertension        Past Medical History:  Diagnosis Date  . GERD (gastroesophageal reflux disease)   . Hypothyroidism   . PONV (postoperative nausea and vomiting)     Patient Active Problem List   Diagnosis Date Noted  . Lumbar facet arthropathy 03/30/2019  . Headache 09/29/2017  . 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 BREAST LUMPECTOMY    . LAPAROTOMY    . THYROIDECTOMY N/A 01/16/2017   Procedure: THYROIDECTOMY;  Surgeon: Izora Gala, MD;  Location: [place];  Service: ENT;  Laterality: N/A;  RNFA ([place])  . TONSILLECTOMY    . wisdom teeth       OB History   No  obstetric history on file.     Family History  Problem Relation Age of Onset  . Diabetes Mother   . COPD Mother   . High blood pressure Mother   . Prostate cancer Father   . Asthma Brother     Social History   Tobacco Use  . Smoking status: Former Smoker    Types: Cigarettes    Quit date: 2012    Years since quitting: 9.2  . Smokeless tobacco: Never Used  Substance Use Topics  . Alcohol use: Yes    Comment: occ  . Drug use: No    Home Medications Prior to Admission medications   Medication Sig Start Date End Date Taking? Authorizing Provider  acyclovir (ZOVIRAX) 400 MG tablet acyclovir 400 mg tablet  TAKE 1 TABLET BY MOUTH TWICE A DAY    [provider]  Cholecalciferol (VITAMIN D3 SUPER STRENGTH) 50 MCG (2000 UT) CAPS Take 1 capsule by mouth daily.    [provider]  diphenhydrAMINE (BENADRYL) 25 MG tablet Take 25 mg by mouth at bedtime.    [provider]  docusate sodium (COLACE) 100 MG capsule Take 300 mg by mouth daily.    [provider]  famotidine (PEPCID) 20 MG tablet Take 20 mg by mouth 2 (two) times daily.    [provider]  FLUoxetine (PROZAC) 10 MG tablet Take 1 tablet (10 mg total)  by mouth daily. 04/18/19   Corena Herter, PA-C  levothyroxine (SYNTHROID, LEVOTHROID) 125 MCG tablet Take 125 mcg by mouth daily.  11/26/16   [provider]  loratadine (CLARITIN) 10 MG tablet Take 10 mg by mouth daily.    [provider]  Magnesium Oxide, Antacid, 500 MG CAPS Take by mouth.    [provider]  naproxen (NAPROSYN) 375 MG tablet Take 1 tablet (375 mg total) by mouth 2 (two) times daily for 7 days. 11/02/19 11/09/19  Janeece Fitting, PA-C  rosuvastatin (CRESTOR) 40 MG tablet Take 1 tablet (40 mg total) by mouth daily. 12/17/18   Belva Crome, MD    Allergies    No known allergies  Review of Systems   Review of Systems  Constitutional: Positive for diaphoresis. Negative for fever.    Respiratory: Negative for shortness of breath.   Cardiovascular: Positive for chest pain.  Gastrointestinal: Positive for nausea. Negative for vomiting.    Physical Exam Updated Vital Signs BP 124/69   Pulse (!) 57   Temp 98.7 F (37.1 C) (Oral)   Resp 16   Ht 5\' 10"  (1.778 m)   Wt 80.8 kg   SpO2 100%   BMI 25.55 kg/m   Physical Exam  ED Results / Procedures / Treatments   Labs (all labs ordered are listed, but only abnormal results are displayed) Labs Reviewed  CBC WITH DIFFERENTIAL/PLATELET - Abnormal; Notable for the following components:      Result Value   RBC 3.82 (*)    Hemoglobin 11.8 (*)    All other components within normal limits  D-DIMER, QUANTITATIVE (NOT AT [facility]) - Abnormal; Notable for the following components:   D-Dimer, Quant 0.61 (*)    All other components within normal limits  COMPREHENSIVE METABOLIC PANEL  LIPASE, BLOOD  TSH  TROPONIN I (HIGH SENSITIVITY)  TROPONIN I (HIGH SENSITIVITY)    EKG EKG Interpretation  Date/Time:  Wednesday November 02 2019 12:05:57 EDT Ventricular Rate:  59 PR Interval:    QRS Duration: 98 QT Interval:  413 QTC Calculation: 410 R Axis:   72 Text Interpretation: Sinus rhythm Minimal ST depression No significant change since last tracing Confirmed by Blanchie Dessert [phone]) on 11/02/2019 12:36:34 PM   Radiology DG Chest 2 View  Result Date: 11/02/2019 CLINICAL DATA:  Midline chest pain for 10 days radiating to LEFT arm EXAM: CHEST - 2 VIEW COMPARISON:  04/18/2019 FINDINGS: Normal heart size, mediastinal contours, and pulmonary vascularity. Lungs clear. No pleural effusion or pneumothorax. Bones unremarkable. IMPRESSION: Normal exam. Electronically Signed   By: Lavonia Dana M.D.   On: 11/02/2019 13:08   CT Angio Chest PE W and/or Wo Contrast  Result Date: 11/02/2019 CLINICAL DATA:  Generalized chest pain, LEFT leg pain, BILATERAL arm pain, abdominal pain, shortness of breath, history GERD, tachycardia, former  smoker, question pulmonary embolism EXAM: CT ANGIOGRAPHY CHEST WITH CONTRAST TECHNIQUE: Multidetector CT imaging of the chest was performed using the standard protocol during bolus administration of intravenous contrast. Multiplanar CT image reconstructions and MIPs were obtained to evaluate the vascular anatomy. CONTRAST:  151mL OMNIPAQUE IOHEXOL 350 MG/ML SOLN COMPARISON:  None FINDINGS: Cardiovascular: Aorta normal caliber without aneurysm or dissection. Heart size normal. No pericardial effusion. Pulmonary arteries adequately opacified and patent. No evidence of pulmonary embolism. Mediastinum/Nodes: Base of cervical region normal appearance. Esophagus normal appearance. No thoracic adenopathy. Lungs/Pleura: Lungs clear. No pulmonary infiltrate, pleural effusion or pneumothorax. Upper Abdomen: Visualized upper abdomen unremarkable Musculoskeletal: No osseous abnormalities.  Review of the MIP images confirms the above findings. IMPRESSION: Normal CTA chest. No evidence of pulmonary embolism. No acute intrathoracic abnormalities. Electronically Signed   By: Lavonia Dana M.D.   On: 11/02/2019 15:41    Procedures Procedures (including critical care time)  Medications Ordered in ED Medications  ketorolac (TORADOL) 15 MG/ML injection 15 mg (15 mg Intravenous Given 11/02/19 1407)  iohexol (OMNIPAQUE) 350 MG/ML injection 100 mL (100 mLs Intravenous Contrast Given 11/02/19 1515)    ED Course  I have reviewed the triage vital signs and the nursing notes.  Pertinent labs & imaging results that were available during my care of the patient were reviewed by me and considered in my medical decision making (see chart for details).  Clinical Course as of Nov 02 1618  Wed Nov 02, 2019  1530 No prior history of PE.   D-Dimer, Quant(!): 0.61 [JS]    Clinical Course User Index [JS] Janeece Fitting, PA-C   MDM Rules/Calculators/A&P  Patient with a PMH of Thyroid disease, hyperlipidemia presents to the ED with a  chief complaint of chest pain x 10 days. Symptoms first began with substernal chest pressure, has taken nitro SLx 2 from her husband with some improvement in symptoms.  Patient was also seen for a similar complaint of chest pain in summer 2020, was diagnosed with likely anxiety, was started on SSRI which she has continued to take according to patient  She denies any shortness of breath, headaches, weakness, fevers.  She is overall well-appearing.  Vital signs are within normal limits, no tachycardia, no hypoxia, blood pressure is within normal limits.  During primary evaluation, she is overall well-appearing, does report this pain has been ongoing for the past 2 weeks, she did have an episode where she became diaphoretic due to the pain.  Was advised when she did not seek care in the previous 10 days, she reports "it was inconvenient ".  Is not have any prior history of cardiac disease, no family history of cardiac disease, no history of blood clots.  Is are clear to auscultation without any wheezing, rhonchi, rales.  No murmur auscultated, abdomen is soft, nontender to palpation.  No bilateral pitting edema, no calf tenderness.  Interpreation of her labs showed a CBC without any leukocytosis, hemoglobin slightly decreased, but stable.  CMP without any electrolyte derangement, no signs of dehydration.  Creatinine level is within normal limits.  LFTs are unremarkable.  First troponin negative,  EKG is NSR, without ST elevations. Low suspicion for ACS as symptoms have been ongoing for 10 days, and no prior history of CAD. Does report having cardiac workup and a normal ECHO performed in January 2020 by Dr. Daneen Schick.  LV EF: 55% -  60%   Xray witthout any signs of consolidation, pneumothorax, denies any fevers.Lower suspicion for Covid 19 infection. She does report a high stress job and sitting down for long period of time, doubt PE discussed D dimer with patient, patient is agreeable of this.  Dimer is  slightly elevated, we discussed CT Angio to r/o PE.  CT angio did not show any acute pathology, no pulmonary embolism was noted.  Patient did receive some Toradol while in the ED, reports improvement in her symptoms.  She was encouraged to follow-up with PCP.  Currently does not have a PCP but she should be able to obtain 1 in her network.  Return precautions were discussed at length.    Portions of this note were generated with Dragon  dictation software. Dictation errors may occur despite best attempts at proofreading.  Final Clinical Impression(s) / ED Diagnoses Final diagnoses:  Atypical chest pain    Rx / DC Orders ED Discharge Orders         Ordered    naproxen (NAPROSYN) 375 MG tablet  2 times daily     11/02/19 1619           Janeece Fitting, PA-C 11/02/19 1620    Blanchie Dessert, MD 11/05/19 [phone]

## 2019-11-08 ENCOUNTER — Other Ambulatory Visit: Payer: 59

## 2019-11-08 ENCOUNTER — Other Ambulatory Visit: Payer: Self-pay

## 2019-11-08 DIAGNOSIS — I259 Chronic ischemic heart disease, unspecified: Secondary | ICD-10-CM

## 2019-11-08 DIAGNOSIS — E785 Hyperlipidemia, unspecified: Secondary | ICD-10-CM

## 2019-11-08 DIAGNOSIS — R002 Palpitations: Secondary | ICD-10-CM

## 2019-11-08 LAB — HIGH SENSITIVITY CRP: CRP, High Sensitivity: 4.16 mg/L — ABNORMAL HIGH (ref 0.00–3.00)

## 2019-11-09 LAB — HEPATIC FUNCTION PANEL
ALT: 13 IU/L (ref 0–32)
AST: 15 IU/L (ref 0–40)
Albumin: 4.2 g/dL (ref 3.8–4.8)
Alkaline Phosphatase: 85 IU/L (ref 39–117)
Bilirubin Total: 0.3 mg/dL (ref 0.0–1.2)
Bilirubin, Direct: 0.08 mg/dL (ref 0.00–0.40)
Total Protein: 6.7 g/dL (ref 6.0–8.5)

## 2019-11-09 LAB — LIPID PANEL
Chol/HDL Ratio: 3.4 ratio (ref 0.0–4.4)
Cholesterol, Total: 180 mg/dL (ref 100–199)
HDL: 53 mg/dL (ref >39)
LDL Chol Calc (NIH): 100 mg/dL — ABNORMAL HIGH (ref 0–99)
Triglycerides: 154 mg/dL — ABNORMAL HIGH (ref 0–149)
VLDL Cholesterol Cal: 27 mg/dL (ref 5–40)

## 2019-11-09 LAB — LIPOPROTEIN A (LPA): Lipoprotein (a): 245.7 nmol/L — ABNORMAL HIGH (ref <75.0)

## 2019-11-09 NOTE — Progress Notes (Signed)
Cardiology Office Note:    Date:  11/10/2019   ID:  Sydney Stephens, DOB 09-13-1970, MRN [id number]  PCP:  Linda Hedges, DO  Cardiologist:  No primary care provider on file.   Referring MD: Linda Hedges, DO   Chief Complaint  Patient presents with  . Chest Pain    History of Present Illness:    Sydney Stephens is a 49 y.o. female with a hx of palpitations found secondary to elevated thyroid levels from Synthroid.  Hyper lipidemia, elevated LP(a), and chest discomfort.  Over the past 10 days the patient has had recurring episodes of chest discomfort that she characterizes as aching and in the central chest.  Episodes can last up to 15 minutes.  She feels these episodes are stress related.  There is no exertional component.  She is not having dyspnea.  There are no associated palpitations.  She is physically deconditioned.  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  She has a 26-year history of cigarette smoking.  Stop smoking 9 years ago.  She is not diabetic.  Past Medical History:  Diagnosis Date  . GERD (gastroesophageal reflux disease)   . Hypothyroidism   . PONV (postoperative nausea and vomiting)     Past Surgical History:  Procedure Laterality Date  . BREAST LUMPECTOMY    . LAPAROTOMY    . THYROIDECTOMY N/A 01/16/2017   Procedure: THYROIDECTOMY;  Surgeon: Izora Gala, MD;  Location: [place];  Service: ENT;  Laterality: N/A;  RNFA ([place])  . TONSILLECTOMY    . wisdom teeth      Current Medications: Current Meds  Medication Sig  . acyclovir (ZOVIRAX) 400 MG tablet acyclovir 400 mg tablet  TAKE 1 TABLET BY MOUTH TWICE A DAY  . Cholecalciferol (VITAMIN D3 SUPER STRENGTH) 50 MCG (2000 UT) CAPS Take 1 capsule by mouth daily.  . diphenhydrAMINE (BENADRYL) 25 MG tablet Take 25 mg by mouth at bedtime.  . docusate sodium (COLACE) 100 MG capsule Take 300 mg by mouth daily.  . famotidine (PEPCID) 20 MG tablet Take 20 mg by mouth 2 (two) times daily.  Marland Kitchen  FLUoxetine (PROZAC) 10 MG tablet Take 1 tablet (10 mg total) by mouth daily.  Marland Kitchen levothyroxine (SYNTHROID, LEVOTHROID) 125 MCG tablet Take 125 mcg by mouth daily.   Marland Kitchen loratadine (CLARITIN) 10 MG tablet Take 10 mg by mouth daily.  . Magnesium Oxide, Antacid, 500 MG CAPS Take 1,000 mg by mouth daily.   . rosuvastatin (CRESTOR) 40 MG tablet Take 1 tablet (40 mg total) by mouth daily.     Allergies:   No known allergies   Social History   Socioeconomic History  . Marital status: Married    Spouse name: Not on file  . Number of children: Not on file  . Years of education: Not on file  . Highest education level: Not on file  Occupational History  . Not on file  Tobacco Use  . Smoking status: Former Smoker    Types: Cigarettes    Quit date: 2012    Years since quitting: 9.2  . Smokeless tobacco: Never Used  Substance and Sexual Activity  . Alcohol use: Yes    Comment: occ  . Drug use: No  . Sexual activity: Yes  Other Topics Concern  . Not on file  Social History Narrative  . Not on file   Social Determinants of Health   Financial Resource Strain:   . Difficulty of Paying Living  Expenses:   Food Insecurity:   . Worried About Charity fundraiser in the Last Year:   . Arboriculturist in the Last Year:   Transportation Needs:   . Film/video editor (Medical):   Marland Kitchen Lack of Transportation (Non-Medical):   Physical Activity:   . Days of Exercise per Week:   . Minutes of Exercise per Session:   Stress:   . Feeling of Stress :   Social Connections:   . Frequency of Communication with Friends and Family:   . Frequency of Social Gatherings with Friends and Family:   . Attends Religious Services:   . Active Member of Clubs or Organizations:   . Attends Archivist Meetings:   Marland Kitchen Marital Status:      Family History: The patient's family history includes Asthma in her brother; COPD in her mother; Diabetes in her mother; High blood pressure in her mother; Prostate  cancer in her father.  ROS:   Please see the history of present illness.    No known vascular disease.  All other systems reviewed and are negative.  EKGs/Labs/Other Studies Reviewed:    The following studies were reviewed today:  Recent chest pulmonary embolism CT:  EVENT Monitor 09/18/18: Study Highlights   NSR  Sinus rhythm and sinus tachycardia  No pauses, SVT, AF, bradycardia, or pauses     ECHOCARDIOGRAM 08/2018: Study Conclusions   - Left ventricle: The cavity size was normal. Wall thickness was  normal. Systolic function was normal. The estimated ejection  fraction was in the range of 55% to 60%. Wall motion was normal;  there were no regional wall motion abnormalities. Left  ventricular diastolic function parameters were normal for the  patient&'s age.   EKG:  EKG performed 11/02/2019 in the [facility] emergency room reveals sinus bradycardia with normal overall appearance.  Not stated whether or not chest pain was ongoing at the time.  Recent Labs: 11/02/2019: BUN 19; Creatinine, Ser 0.76; Hemoglobin 11.8; Platelets 264; Potassium 4.6; Sodium 136; TSH 1.925 11/08/2019: ALT 13  Recent Lipid Panel    Component Value Date/Time   CHOL 180 11/08/2019 0838   TRIG 154 (H) 11/08/2019 0838   HDL 53 11/08/2019 0838   CHOLHDL 3.4 11/08/2019 0838   LDLCALC 100 (H) 11/08/2019 0838    Physical Exam:    VS:  BP (!) 98/58   Pulse 82   Ht 5\' 10"  (1.778 m)   Wt 171 lb 12.8 oz (77.9 kg)   SpO2 98%   BMI 24.65 kg/m     Wt Readings from Last 3 Encounters:  11/10/19 171 lb 12.8 oz (77.9 kg)  11/02/19 178 lb 1.6 oz (80.8 kg)  04/18/19 170 lb (77.1 kg)     GEN: Relatively healthy appearing. No acute distress HEENT: Normal NECK: No JVD. LYMPHATICS: No lymphadenopathy CARDIAC:  RRR without murmur, gallop, or edema. VASCULAR:  Normal Pulses. No bruits. RESPIRATORY:  Clear to auscultation without rales, wheezing or rhonchi  ABDOMEN: Soft, non-tender,  non-distended, No pulsatile mass, MUSCULOSKELETAL: No deformity  SKIN: Warm and dry NEUROLOGIC:  Alert and oriented x 3 PSYCHIATRIC:  Normal affect   ASSESSMENT:    1. Precordial pain   2. Palpitations   3. Hyperlipidemia, unspecified hyperlipidemia type   4. Educated about COVID-19 virus infection    PLAN:    In order of problems listed above:  1. Chest discomfort is worrisome despite the absence of calcification noted on recent CT scan.  Her  risk factors include family history, greater than 20-year smoking history (discontinued 9 years ago), and a very atherogenic lipid profile which includes an LDL of 100 on 40 mg of rosuvastatin and an LP(a) greater than 246.  Therefore I have ordered a coronary CTA with FFR to exclude obstructive disease in absence of calcified plaque.  The thrombogenic impact of LP(a) could conceivably be at play. 2. Palpitations resolved after adjustment in Synthroid 3. CT scan will help determine how hard to push the LDL.  Her best treatment may be a PCSK9 as it will also help lower LP(a).  No specific change right now until further information from the coronary CT. 4. She plans to take the COVID-19 vaccine.  Social distancing is being practiced.  Plan clinical follow-up after the CT scan.  Notify us earlier if progression in symptoms or prolonged pain.   Medication Adjustments/Labs and Tests Ordered: Current medicines are reviewed at length with the patient today.  Concerns regarding medicines are outlined above.  Orders Placed This Encounter  Procedures  . CT CORONARY MORPH W/CTA COR W/SCORE W/CA W/CM &/OR WO/CM  . CT CORONARY FRACTIONAL FLOW RESERVE DATA PREP  . CT CORONARY FRACTIONAL FLOW RESERVE FLUID ANALYSIS  . Basic metabolic panel   Meds ordered this encounter  Medications  . metoprolol tartrate (LOPRESSOR) 100 MG tablet    Sig: Take one tablet by mouth 2 hours prior to your CT    Dispense:  1 tablet    Refill:  0    Patient Instructions   Medication Instructions:  Your physician recommends that you continue on your current medications as directed. Please refer to the Current Medication list given to you today.  *If you need a refill on your cardiac medications before your next appointment, please call your pharmacy*   Lab Work: None If you have labs (blood work) drawn today and your tests are completely normal, you will receive your results only by: Marland Kitchen MyChart Message (if you have MyChart) OR . A paper copy in the mail If you have any lab test that is abnormal or we need to change your treatment, we will call you to review the results.   Testing/Procedures: Your physician recommends that you have a Coronary CT performed.   Follow-Up: At [facility], you and your health needs are our priority.  As part of our continuing mission to provide you with exceptional heart care, we have created designated Provider Care Teams.  These Care Teams include your primary Cardiologist (physician) and Advanced Practice Providers (APPs -  Physician Assistants and Nurse Practitioners) who all work together to provide you with the care you need, when you need it.  We recommend signing up for the patient portal called "MyChart".  Sign up information is provided on this After Visit Summary.  MyChart is used to connect with patients for Virtual Visits (Telemedicine).  Patients are able to view lab/test results, encounter notes, upcoming appointments, etc.  Non-urgent messages can be sent to your provider as well.   To learn more about what you can do with MyChart, go to [email or web address].    Your next appointment:   3 month(s)  The format for your next appointment:   In Person  Provider:   You may see Dr. Daneen Schick or one of the following Advanced Practice Providers on your designated Care Team:    Truitt Merle, NP  Cecilie Kicks, NP  Kathyrn Drown, NP    Other Instructions  Your cardiac CT will be  scheduled at one of  the below locations:   [facility] [address] [place], [place] [address] [phone]  [place] [address] [place], [place] [address] [phone]  If scheduled at [facility], please arrive at the [facility] main entrance of [facility] 30 minutes prior to test start time. Proceed to the [facility] Radiology Department (first floor) to check-in and test prep.  If scheduled at [facility], please arrive 15 mins early for check-in and test prep.  Please follow these instructions carefully (unless otherwise directed):  Hold all erectile dysfunction medications at least 3 days (72 hrs) prior to test.  On the Night Before the Test: . Be sure to Drink plenty of water. . Do not consume any caffeinated/decaffeinated beverages or chocolate 12 hours prior to your test. . Do not take any antihistamines 12 hours prior to your test.  On the Day of the Test: . Drink plenty of water. Do not drink any water within one hour of the test. . Do not eat any food 4 hours prior to the test. . You may take your regular medications prior to the test.  . Take metoprolol (Lopressor) two hours prior to test. . FEMALES- please wear underwire-free bra if available       After the Test: . Drink plenty of water. . After receiving IV contrast, you may experience a mild flushed feeling. This is normal. . On occasion, you may experience a mild rash up to 24 hours after the test. This is not dangerous. If this occurs, you can take Benadryl 25 mg and increase your fluid intake. . If you experience trouble breathing, this can be serious. If it is severe call 911 IMMEDIATELY. If it is mild, please call our office. . If you take any of these medications: Glipizide/Metformin, Avandament, Glucavance, please do not take 48 hours after completing test unless otherwise instructed.   Once we have  confirmed authorization from your insurance company, we will call you to set up a date and time for your test.   For non-scheduling related questions, please contact the cardiac imaging nurse navigator should you have any questions/concerns: Marchia Bond, RN Navigator Cardiac Imaging [facility] [phone] office  For scheduling needs, including cancellations and rescheduling, please call [phone].        Signed, Sinclair Grooms, MD  11/10/2019 5:09 PM    [place]

## 2019-11-10 ENCOUNTER — Ambulatory Visit: Payer: 59 | Admitting: Interventional Cardiology

## 2019-11-10 ENCOUNTER — Other Ambulatory Visit: Payer: Self-pay

## 2019-11-10 ENCOUNTER — Encounter: Payer: Self-pay | Admitting: Interventional Cardiology

## 2019-11-10 VITALS — BP 98/58 | HR 82 | Ht 70.0 in | Wt 171.8 lb

## 2019-11-10 DIAGNOSIS — E785 Hyperlipidemia, unspecified: Secondary | ICD-10-CM

## 2019-11-10 DIAGNOSIS — R072 Precordial pain: Secondary | ICD-10-CM

## 2019-11-10 DIAGNOSIS — R002 Palpitations: Secondary | ICD-10-CM | POA: Diagnosis not present

## 2019-11-10 DIAGNOSIS — Z7189 Other specified counseling: Secondary | ICD-10-CM | POA: Diagnosis not present

## 2019-11-10 MED ORDER — METOPROLOL TARTRATE 100 MG PO TABS: ORAL_TABLET | ORAL | 0 refills | Status: DC

## 2019-11-10 NOTE — Patient Instructions (Addendum)
Medication Instructions:  Your physician recommends that you continue on your current medications as directed. Please refer to the Current Medication list given to you today.  *If you need a refill on your cardiac medications before your next appointment, please call your pharmacy*   Lab Work: None If you have labs (blood work) drawn today and your tests are completely normal, you will receive your results only by: Marland Kitchen MyChart Message (if you have MyChart) OR . A paper copy in the mail If you have any lab test that is abnormal or we need to change your treatment, we will call you to review the results.   Testing/Procedures: Your physician recommends that you have a Coronary CT performed.   Follow-Up: At [facility], you and your health needs are our priority.  As part of our continuing mission to provide you with exceptional heart care, we have created designated Provider Care Teams.  These Care Teams include your primary Cardiologist (physician) and Advanced Practice Providers (APPs -  Physician Assistants and Nurse Practitioners) who all work together to provide you with the care you need, when you need it.  We recommend signing up for the patient portal called "MyChart".  Sign up information is provided on this After Visit Summary.  MyChart is used to connect with patients for Virtual Visits (Telemedicine).  Patients are able to view lab/test results, encounter notes, upcoming appointments, etc.  Non-urgent messages can be sent to your provider as well.   To learn more about what you can do with MyChart, go to [email or web address].    Your next appointment:   3 month(s)  The format for your next appointment:   In Person  Provider:   You may see Dr. Daneen Schick or one of the following Advanced Practice Providers on your designated Care Team:    Truitt Merle, NP  Cecilie Kicks, NP  Kathyrn Drown, NP    Other Instructions  Your cardiac CT will be scheduled at one of the  below locations:   [facility] [address] [place], [place] [address] [phone]  [place] [address] [place], [place] [address] [phone]  If scheduled at [facility], please arrive at the [facility] main entrance of [facility] 30 minutes prior to test start time. Proceed to the [facility] Radiology Department (first floor) to check-in and test prep.  If scheduled at [facility], please arrive 15 mins early for check-in and test prep.  Please follow these instructions carefully (unless otherwise directed):  Hold all erectile dysfunction medications at least 3 days (72 hrs) prior to test.  On the Night Before the Test: . Be sure to Drink plenty of water. . Do not consume any caffeinated/decaffeinated beverages or chocolate 12 hours prior to your test. . Do not take any antihistamines 12 hours prior to your test.  On the Day of the Test: . Drink plenty of water. Do not drink any water within one hour of the test. . Do not eat any food 4 hours prior to the test. . You may take your regular medications prior to the test.  . Take metoprolol (Lopressor) two hours prior to test. . FEMALES- please wear underwire-free bra if available       After the Test: . Drink plenty of water. . After receiving IV contrast, you may experience a mild flushed feeling. This is normal. . On occasion, you may experience a  mild rash up to 24 hours after the test. This is not dangerous. If this occurs, you can take Benadryl 25 mg and increase your fluid intake. . If you experience trouble breathing, this can be serious. If it is severe call 911 IMMEDIATELY. If it is mild, please call our office. . If you take any of these medications: Glipizide/Metformin, Avandament, Glucavance, please do not take 48 hours after completing test unless otherwise instructed.   Once we have confirmed  authorization from your insurance company, we will call you to set up a date and time for your test.   For non-scheduling related questions, please contact the cardiac imaging nurse navigator should you have any questions/concerns: Marchia Bond, RN Navigator Cardiac Imaging [facility] [phone] office  For scheduling needs, including cancellations and rescheduling, please call [phone].

## 2019-12-09 ENCOUNTER — Telehealth (HOSPITAL_COMMUNITY): Payer: Self-pay | Admitting: *Deleted

## 2019-12-09 ENCOUNTER — Telehealth: Payer: Self-pay | Admitting: Pharmacist

## 2019-12-09 NOTE — Telephone Encounter (Signed)
Attempted to call patient regarding upcoming cardiac CT appointment. Left message on voicemail with name and callback number Merle Tai RN Navigator Cardiac Imaging [facility] [phone] Office [phone] Cell 

## 2019-12-09 NOTE — Telephone Encounter (Signed)
Pt called back and instructions regarding cardiac CT scan reviewed.  Pt expressed understanding that she will have to pick up medication at the [facility]. Cardiology office and to not take medication if HR is less than 55.  Pt knows to avoid HR stimulating medications and NPO status.  Pt states that she is typically a hard stick requiring ultrasound guided IV placement.   Burley Saver, RN 12/09/2019

## 2019-12-09 NOTE — Telephone Encounter (Signed)
Corlanor 5mg  tablet sample provided. Given to patients husband.

## 2019-12-12 ENCOUNTER — Ambulatory Visit (HOSPITAL_COMMUNITY)
Admission: RE | Admit: 2019-12-12 | Discharge: 2019-12-12 | Disposition: A | Discharge status: Home / Self Care | Payer: 59 | Source: Ambulatory Visit | Attending: Interventional Cardiology | Admitting: Interventional Cardiology

## 2019-12-12 ENCOUNTER — Encounter (HOSPITAL_COMMUNITY): Payer: Self-pay

## 2019-12-12 ENCOUNTER — Other Ambulatory Visit: Payer: Self-pay

## 2019-12-12 DIAGNOSIS — R072 Precordial pain: Secondary | ICD-10-CM | POA: Insufficient documentation

## 2019-12-12 MED ORDER — IOHEXOL 350 MG/ML SOLN
80.0000 mL | Freq: Once | INTRAVENOUS | Status: AC | PRN
  Administered 2019-12-12: 10:00:00 80 mL via INTRAVENOUS

## 2019-12-12 MED ORDER — NITROGLYCERIN 0.4 MG SL SUBL
0.8000 mg | SUBLINGUAL_TABLET | Freq: Once | SUBLINGUAL | Status: AC
  Administered 2019-12-12: 10:00:00 0.8 mg via SUBLINGUAL

## 2019-12-12 MED ORDER — NITROGLYCERIN 0.4 MG SL SUBL
SUBLINGUAL_TABLET | SUBLINGUAL | Status: AC
  Filled 2019-12-12: qty 2

## 2019-12-15 ENCOUNTER — Other Ambulatory Visit: Payer: Self-pay | Admitting: Interventional Cardiology

## 2019-12-18 ENCOUNTER — Encounter: Payer: Self-pay | Admitting: Orthopaedic Surgery

## 2019-12-19 MED ORDER — PREDNISONE 10 MG (21) PO TBPK
ORAL_TABLET | ORAL | 0 refills | Status: DC
Start: 2019-12-19 — End: 2020-01-17

## 2020-01-17 MED ORDER — PREDNISONE 10 MG (21) PO TBPK: ORAL_TABLET | ORAL | 0 refills | Status: DC

## 2020-01-17 NOTE — Addendum Note (Signed)
Addended by: Meyer Cory on: 01/17/2020 11:44 AM   Modules accepted: Orders

## 2020-05-03 ENCOUNTER — Encounter: Payer: Self-pay | Admitting: Surgery

## 2020-05-03 ENCOUNTER — Ambulatory Visit (INDEPENDENT_AMBULATORY_CARE_PROVIDER_SITE_OTHER): Payer: 59 | Admitting: Surgery

## 2020-05-03 ENCOUNTER — Other Ambulatory Visit: Payer: Self-pay | Admitting: Orthopaedic Surgery

## 2020-05-03 ENCOUNTER — Ambulatory Visit: Payer: Self-pay

## 2020-05-03 VITALS — BP 120/74 | HR 63 | Ht 70.0 in | Wt 171.8 lb

## 2020-05-03 DIAGNOSIS — M545 Low back pain, unspecified: Secondary | ICD-10-CM

## 2020-05-03 DIAGNOSIS — M4726 Other spondylosis with radiculopathy, lumbar region: Secondary | ICD-10-CM

## 2020-05-03 DIAGNOSIS — M4310 Spondylolisthesis, site unspecified: Secondary | ICD-10-CM

## 2020-05-03 DIAGNOSIS — G8929 Other chronic pain: Secondary | ICD-10-CM | POA: Diagnosis not present

## 2020-05-03 NOTE — Progress Notes (Addendum)
Per [name], PA, patient was given IM injection of Toradol 30mg  in the right glute and Depo 80mg  in the left glute.  Patient handled both injections well.

## 2020-05-03 NOTE — Telephone Encounter (Signed)
Ok to rf? 

## 2020-05-03 NOTE — Progress Notes (Signed)
Office Visit Note   Patient: Sydney Stephens           Date of Birth: 06/17/1971           MRN: [id number] Visit Date: 05/03/2020              Requested by: Sydney Stephens, Sydney Stephens, Sydney Stephens, Sydney Stephens Sydney Stephens,  Sydney Stephens [id number] PCP: Sydney Hedges, DO   Assessment & Plan: Visit Diagnoses:  1. Chronic bilateral low back pain, unspecified whether sciatica present   2. Other spondylosis with radiculopathy, lumbar region   3. Retrolisthesis of vertebrae     Plan: With patient's ongoing symptoms over the last couple years and failed conservative treatment with multiple prednisone tapers I recommend getting a lumbar MRI to rule out HNP/stenosis.  Patient has not had a scan in the past.  Follow-up with Dr. Lorin Stephens in a few weeks for recheck and to discuss results.  In hopes of giving patient some improvement of her pain until she returns I elected to give her Depo 80 mg and Toradol 30 mg IM injections.  No other medication given today.  All questions answered.  Follow-Up Instructions: Return in about 4 weeks (around 05/31/2020) for With Dr. Lorin Stephens to review lumbar MRI.   Orders:  Orders Placed This Encounter  Procedures  . XR Lumbar Spine 2-3 Views  . MR Lumbar Spine w/o contrast   No orders of the defined types were placed in this encounter.     Procedures: No procedures performed   Clinical Data: No additional findings.   Subjective: Chief Complaint  Patient presents with  . Lower Back - Pain    HPI 49 year old white female comes in today with complaints of worsening low back pain and right lower extremity radiculopathy.  Patient last seen by Dr. Lorin Stephens August 2020 for the same problem and also in 2019.  Since 2019 patient has had at least 3 prednisone tapers for her low back pain.  Most recently given June 2021.  Patient states that today she got up and her back locked up on her.  Having severe pain in the right side of her low back that radiates  into the buttock and down her right leg.  Nothing on the left side.  No complaints of bowel or bladder incontinence.  Patient states that she did talk to Dr. Lorin Stephens today while he was out in the [facility] clinic and he advised her no more prednisone tapers.  Patient has marked discomfort when she is up walking or bending. Review of Systems No current cardiac pulmonary GI GU issues  Objective: Vital Signs: BP 120/74   Pulse 63   Ht 5\' 10"  (1.778 m)   Wt 171 lb 12.8 oz (77.9 kg)   BMI 24.65 kg/m   Physical Exam HENT:     Head: Normocephalic and atraumatic.  Eyes:     Extraocular Movements: Extraocular movements intact.     Pupils: Pupils are equal, round, and reactive to light.  Pulmonary:     Effort: No respiratory distress.  Musculoskeletal:     Comments: He is antalgic.  Limited lumbar flexion extension.  Positive right-sided lumbar paraspinal tenderness.  Positive right greater left-sided notch tenderness.  Tenderness over the right hip greater trochanter bursa.  Negative logroll bilateral hips.  Some discomfort with left straight leg raise. Neurovascular intact.  Bilateral calves nontender.  No motor deficits.  Neurological:     General: No focal deficit present.  Mental Status: She is alert and oriented to person, place, and time.  Psychiatric:        Mood and Affect: Mood normal.     Ortho Exam  Specialty Comments:  No specialty comments available.  Imaging: No results found.   PMFS History: Patient Active Problem List   Diagnosis Date Noted  . Lumbar facet arthropathy 03/30/2019  . Headache 09/29/2017  . 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 GERD (gastroesophageal reflux disease)   . Hypothyroidism   . PONV (postoperative nausea and vomiting)     Family History  Problem Relation Age of Onset  . Diabetes Mother   . COPD Mother   . High blood pressure Mother   . Prostate cancer Father   . Asthma Brother     Past  Surgical History:  Procedure Laterality Date  . BREAST LUMPECTOMY    . LAPAROTOMY    . THYROIDECTOMY N/A 01/16/2017   Procedure: THYROIDECTOMY;  Surgeon: Sydney Gala, MD;  Location: [facility];  Service: ENT;  Laterality: N/A;  RNFA (Sydney Stephens)  . TONSILLECTOMY    . wisdom teeth     Social History   Occupational History  . Not on file  Tobacco Use  . Smoking status: Former Smoker    Types: Cigarettes    Quit date: 2012    Years since quitting: 9.7  . Smokeless tobacco: Never Used  Vaping Use  . Vaping Use: Never used  Substance and Sexual Activity  . Alcohol use: Yes    Comment: occ  . Drug use: No  . Sexual activity: Yes

## 2020-05-03 NOTE — Telephone Encounter (Signed)
FYI  I called and left her a message . She needs ROV and lumbar MRI. Not good to keep taking prednisone.

## 2020-05-22 ENCOUNTER — Ambulatory Visit
Admission: RE | Admit: 2020-05-22 | Discharge: 2020-05-22 | Disposition: A | Discharge status: Home / Self Care | Payer: 59 | Source: Ambulatory Visit | Attending: Surgery | Admitting: Surgery

## 2020-05-22 ENCOUNTER — Other Ambulatory Visit: Payer: Self-pay

## 2020-05-22 DIAGNOSIS — M4726 Other spondylosis with radiculopathy, lumbar region: Secondary | ICD-10-CM

## 2020-05-22 DIAGNOSIS — M4310 Spondylolisthesis, site unspecified: Secondary | ICD-10-CM

## 2020-05-25 ENCOUNTER — Encounter: Payer: Self-pay | Admitting: Orthopaedic Surgery

## 2020-05-25 ENCOUNTER — Ambulatory Visit: Payer: 59 | Admitting: Orthopaedic Surgery

## 2020-05-25 ENCOUNTER — Other Ambulatory Visit: Payer: Self-pay

## 2020-05-25 VITALS — BP 123/79 | HR 70 | Ht 70.0 in | Wt 171.0 lb

## 2020-05-25 DIAGNOSIS — M5136 Other intervertebral disc degeneration, lumbar region: Secondary | ICD-10-CM | POA: Diagnosis not present

## 2020-05-25 DIAGNOSIS — M47816 Spondylosis without myelopathy or radiculopathy, lumbar region: Secondary | ICD-10-CM | POA: Diagnosis not present

## 2020-05-25 DIAGNOSIS — G8929 Other chronic pain: Secondary | ICD-10-CM | POA: Diagnosis not present

## 2020-05-25 DIAGNOSIS — M545 Low back pain, unspecified: Secondary | ICD-10-CM | POA: Diagnosis not present

## 2020-05-25 MED ORDER — TRAMADOL HCL 50 MG PO TABS: 50.0000 mg | ORAL_TABLET | Freq: Four times a day (QID) | ORAL | 0 refills | Status: DC | PRN

## 2020-05-25 MED ORDER — CYCLOBENZAPRINE HCL 10 MG PO TABS
10.0000 mg | ORAL_TABLET | Freq: Every day | ORAL | 2 refills | Status: DC
Start: 2020-05-25 — End: 2021-07-15

## 2020-05-25 NOTE — Progress Notes (Signed)
Office Visit Note   Patient: Sydney Stephens           Date of Birth: 02-Feb-1971           MRN: [id number] Visit Date: 05/25/2020              Requested by: Linda Hedges, [place], [place], [place],  [place] [address] PCP: Linda Hedges, DO   Assessment & Plan: Visit Diagnoses:  1. Chronic bilateral low back pain, unspecified whether sciatica present   2. Lumbar facet arthropathy   3. Other intervertebral disc degeneration, lumbar region     Plan: We outlined a wellness plan.  Patient needs to get back to some type of activity before her dance activity is able to start back up since this may be many months and she needs to work on weight loss, dieting core strengthening and restart physical therapy followed by regular workout activity.  She does not have any levels with severe stenosis.  She does not really want any surgery we discussed each level that she had and she has some degenerative changes at each level throughout the lumbar spine.  L3-4 seems to be the worst and likely the facet arthropathy is causing her problems as well as the endplate edema.  We will check her back again in a few months and see how she is doing with therapy and weight loss.  Flexeril was sent she can take at night and she can occasionally use an Ultram when the pain is so bad she is not able to tolerate it.  Follow-Up Instructions: Return in about 2 months (around 07/25/2020).   Orders:  Orders Placed This Encounter  Procedures  . Ambulatory referral to Physical Therapy   Meds ordered this encounter  Medications  . cyclobenzaprine (FLEXERIL) 10 MG tablet    Sig: Take 1 tablet (10 mg total) by mouth at bedtime.    Dispense:  30 tablet    Refill:  2  . traMADol (ULTRAM) 50 MG tablet    Sig: Take 1 tablet (50 mg total) by mouth every 6 (six) hours as needed.    Dispense:  30 tablet    Refill:  0      Procedures: No procedures performed   Clinical Data: No  additional findings.   Subjective: Chief Complaint  Patient presents with  . Lower Back - Pain, Follow-up    MRI lumbar review    HPI 49 year old female seen with follow-up with ongoing radicular symptoms times greater than a year.  She has been on prednisone and had a Dosepak in the past recent Toradol injection one half and Medrol in the opposite hip.  Pain remains severe and she has multilevel retrolisthesis 4 mm at L2-3 and also L3-4.  MRI scan is available and is reviewed with her today see results below.  Patient states her symptoms are constant.  With Covid and working at home she has gained some weight.  She is to enjoy shag dancing but this is been stopped due to Covid.  She has gained 20 to 30 pounds and is having increased back pain buttocks pain pain at the waist radiates into her buttocks sometimes down to her leg occasionally into the lateral calf.  When she does have calf pain that radiates to the foot is usually on the left.  MRI scan shows some L5-S1 disc fusion on the left but are most pronounced changes at L3-4 with endplate  edema secondary changes and significant facet arthropathy where she has 4 mm retrolisthesis and mild to moderate stenosis.  Patient has pain after prolonged standing but denies classic neurogenic claudication symptoms.  She is used ibuprofen Biofreeze Tylenol, cortisone injection, all without relief.  Last year she went to therapy and states this did give her some improvement.  Patient states quality of life is not good due to the constant pain.  She does get relief temporarily when she gets prednisone.  Review of Systems 14 point system update unchanged from 03/30/2019 office visit other than as mentioned above.  She is a previous smoker no bowel bladder symptoms no chills or fever.   Objective: Vital Signs: BP 123/79   Pulse 70   Ht 5\' 10"  (1.778 m)   Wt 171 lb (77.6 kg)   BMI 24.54 kg/m   Physical Exam Constitutional:      Appearance: She is  well-developed.  HENT:     Head: Normocephalic.     Right Ear: External ear normal.     Left Ear: External ear normal.  Eyes:     Pupils: Pupils are equal, round, and reactive to light.  Neck:     Thyroid: No thyromegaly.     Trachea: No tracheal deviation.  Cardiovascular:     Rate and Rhythm: Normal rate.  Pulmonary:     Effort: Pulmonary effort is normal.  Abdominal:     Palpations: Abdomen is soft.  Skin:    General: Skin is warm and dry.  Neurological:     Mental Status: She is alert and oriented to person, place, and time.  Psychiatric:        Behavior: Behavior normal.     Ortho Exam patient has increased pain with forward flexion limited to fingertips to knee level.  Pain with extension pain with lateral bending and rotation.  Knee and ankle jerk are intact she is able to heel and toe walk but has some pain in her back with this.  Sensory dermatomes are intact.  She has sciatic notch tenderness both right and left and moderate trochanteric bursal tenderness right and left. Specialty Comments:  No specialty comments available.  Imaging: CLINICAL DATA:  Lower back pain with progressive bilateral hip/buttock/leg pain.  EXAM: MRI LUMBAR SPINE WITHOUT CONTRAST  TECHNIQUE: Multiplanar, multisequence MR imaging of the lumbar spine was performed. No intravenous contrast was administered.  COMPARISON:  Lumbar radiographs 05/03/2020  FINDINGS: Segmentation: 5 non rib-bearing lumbar vertebral bodies. The inferior-most fully formed intervertebral disc is labeled L5-S1.  Alignment: Levocurvature of the lumbar spine. Approximately 4 mm of retrolisthesis of L2 on L3 and L3 on L4.  Vertebrae: There is right eccentric degenerative/discogenic endplate signal changes about the L3-L4 disc. Scattered degenerative Schmorl's nodes. There are a few scattered benign T1 hyperintense vertebral venous malformations. Otherwise, no focal abnormal marrow signal to suggest acute  fracture, discitis/osteomyelitis, or suspicious bone lesion  Conus medullaris and cauda equina: Conus extends to the T12-L1 level. Conus and cauda equina appear normal.  Paraspinal and other soft tissues: Negative.  Disc levels:  T11-T12: Only imaged on sagittal sequences without evidence of significant canal or foraminal stenosis.  T12-L1: No significant disc protrusion, foraminal stenosis, or canal stenosis.  L1-L2: Minimal disc bulge and mild bilateral facet hypertrophy without significant canal or foraminal stenosis.  L2-L3: Approximately 4 mm of retrolisthesis of L2 on L3. small broad-based disc bulge and moderate bilateral facet hypertrophy. Mild bilateral subarticular recess stenosis. No significant canal or foraminal stenosis.  L3-L4: Approximately 4 mm of retrolisthesis of L3 on L4. Moderate to marked bilateral facet hypertrophy. Mild canal stenosis and moderate right subarticular recess stenosis. Mild bilateral foraminal stenosis.  L4-L5: Small broad-based disc bulge with superimposed left foraminal/extraforaminal disc protrusion. Moderate left facet hypertrophy. Resulting moderate left foraminal and subarticular recess stenosis. No significant right foraminal stenosis or central canal stenosis.  L5-S1: Small broad-based disc bulge with superimposed left paracentral disc protrusion. Resulting mild left subarticular recess stenosis. No significant canal or foraminal stenosis.  IMPRESSION: 1. At L4-L5 there is a left foraminal/extraforaminal disc protrusion, which results in moderate left foraminal and subarticular recess stenosis. 2. At L3-L4 there is moderate right subarticular recess stenosis, mild canal stenosis, and mild bilateral foraminal stenosis. 3. Right eccentric degenerative disc disease at L3-L4.   Electronically Signed   By: Margaretha Sheffield MD   On: 05/22/2020 07:55    PMFS History: Patient Active Problem List   Diagnosis  Date Noted  . Other intervertebral disc degeneration, lumbar region 05/25/2020  . Lumbar facet arthropathy 03/30/2019  . Headache 09/29/2017  . 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 GERD (gastroesophageal reflux disease)   . Hypothyroidism   . PONV (postoperative nausea and vomiting)     Family History  Problem Relation Age of Onset  . Diabetes Mother   . COPD Mother   . High blood pressure Mother   . Prostate cancer Father   . Asthma Brother     Past Surgical History:  Procedure Laterality Date  . BREAST LUMPECTOMY    . LAPAROTOMY    . THYROIDECTOMY N/A 01/16/2017   Procedure: THYROIDECTOMY;  Surgeon: Izora Gala, MD;  Location: [facility];  Service: ENT;  Laterality: N/A;  RNFA ([facility])  . TONSILLECTOMY    . wisdom teeth     Social History   Occupational History  . Not on file  Tobacco Use  . Smoking status: Former Smoker    Types: Cigarettes    Quit date: 2012    Years since quitting: 9.8  . Smokeless tobacco: Never Used  Vaping Use  . Vaping Use: Never used  Substance and Sexual Activity  . Alcohol use: Yes    Comment: occ  . Drug use: No  . Sexual activity: Yes

## 2020-06-11 ENCOUNTER — Encounter: Payer: Self-pay | Admitting: Physical Therapy

## 2020-06-11 ENCOUNTER — Other Ambulatory Visit: Payer: Self-pay

## 2020-06-11 ENCOUNTER — Ambulatory Visit: Payer: 59 | Attending: Orthopaedic Surgery | Admitting: Physical Therapy

## 2020-06-11 DIAGNOSIS — G8929 Other chronic pain: Secondary | ICD-10-CM | POA: Insufficient documentation

## 2020-06-11 DIAGNOSIS — M6281 Muscle weakness (generalized): Secondary | ICD-10-CM | POA: Diagnosis present

## 2020-06-11 DIAGNOSIS — M5442 Lumbago with sciatica, left side: Secondary | ICD-10-CM | POA: Insufficient documentation

## 2020-06-11 DIAGNOSIS — M62830 Muscle spasm of back: Secondary | ICD-10-CM | POA: Diagnosis present

## 2020-06-11 DIAGNOSIS — M5441 Lumbago with sciatica, right side: Secondary | ICD-10-CM | POA: Insufficient documentation

## 2020-06-11 NOTE — Patient Instructions (Signed)
    Home exercise program created by Anthoney Sheppard, PT.  For questions, please contact Aleysia Oltmann via phone at [phone] or email at [email or web address]  [facility] [place] [address]  [address] [place], [place], [address] Phone: [phone]   Fax:  [phone]    

## 2020-06-11 NOTE — Therapy (Signed)
[facility] [address]  [place], [place], [address] Phone: [phone]   Fax:  [phone]  Physical Therapy Evaluation  Patient Details  Name: Sydney Stephens MRN: [id number] Date of Birth: 09/04/1970 Referring Provider (PT): Rodell Perna, MD   Encounter Date: 06/11/2020   PT End of Session - 06/11/20 0804    Visit Number 1    Number of Visits 12    Date for PT Re-Evaluation 07/23/20    Authorization Type UHC    PT Start Time 0804    PT Stop Time 0850    PT Time Calculation (min) 46 min    Activity Tolerance Patient tolerated treatment well;Patient limited by pain    Behavior During Therapy [facility] for tasks assessed/performed           Past Medical History:  Diagnosis Date  . GERD (gastroesophageal reflux disease)   . Hypothyroidism   . PONV (postoperative nausea and vomiting)     Past Surgical History:  Procedure Laterality Date  . BREAST LUMPECTOMY    . LAPAROTOMY    . THYROIDECTOMY N/A 01/16/2017   Procedure: THYROIDECTOMY;  Surgeon: Izora Gala, MD;  Location: [facility];  Service: ENT;  Laterality: N/A;  RNFA ([place])  . TONSILLECTOMY    . wisdom teeth      There were no vitals filed for this visit.    Subjective Assessment - 06/11/20 0807    Subjective Pt reports known back issue in lower back. On 05/03/20 was bent over and felt her back slip and pain has not gone away. Pt reports initial occurrence ~11 yrs ago at which time PT and ESI helped. Has been on prednisone but no relief as of yet.    Limitations Sitting;Standing;House hold activities;Lifting    How long can you sit comfortably? 2 hrs    How long can you stand comfortably? 2-3 hrs    Diagnostic tests Lumbar MRI 05/22/20: 1. At L4-L5 there is a left foraminal/extraforaminal discprotrusion, which results in moderate left foraminal andsubarticular recess stenosis.2. At L3-L4 there is moderate right subarticular recess stenosis,mild canal stenosis,  and mild bilateral foraminal stenosis.3. Right eccentric degenerative disc disease at L3-L4.    Patient Stated Goals "no pain"    Currently in Pain? No/denies    Pain Score 10-Worst pain ever   at worst   Pain Location Back    Pain Orientation Mid;Lower;Right;Left    Pain Descriptors / Indicators Sharp;Constant;Aching    Pain Type Acute pain    Pain Radiating Towards buzzing pain down back of B legs to calves    Pain Onset More than a month ago    Pain Frequency Intermittent    Aggravating Factors  prolonged sitting or standing    Pain Relieving Factors laying down, heating pad or cold compress, muscle relaxer and tramadol at night    Effect of Pain on Daily Activities limited activity tolerance with cooking (standing, bending and twisting)              [facility] PT Assessment - 06/11/20 0804      Assessment   Medical Diagnosis Chronic B LBP with B sciatica    Referring Provider (PT) Rodell Perna, MD    Onset Date/Surgical Date 05/03/20    Hand Dominance Right    Next MD Visit none scheduled    Prior Therapy PT for LBP ~11 yrs ago      Precautions   Precautions None  Restrictions   Weight Bearing Restrictions No      Balance Screen   Has the patient fallen in the past 6 months No    Has the patient had a decrease in activity level because of a fear of falling?  No    Is the patient reluctant to leave their home because of a fear of falling?  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      Cognition   Overall Cognitive Status Within Functional Limits for tasks assessed      Observation/Other Assessments   Focus  on Therapeutic Outcomes (FOTO)  Lumbar - 44% (56% limitation); Predicted 61% (39% limitation)      Sensation   Light Touch Appears Intact      ROM / Strength   AROM / PROM / Strength AROM;Strength      AROM   Overall AROM Comments pain with all motions except rotation    AROM Assessment Site Lumbar    Lumbar Flexion hands to ankles    Lumbar Extension 70% limited    Lumbar - Right Side Bend hand to knee joint line    Lumbar - Left Side Bend hand to mid thigh    Lumbar - Right Rotation WFL    Lumbar - Left Rotation [facility]      Strength   Overall Strength Comments pain with all resisted motions in B hips    Strength Assessment Site Hip;Knee;Ankle    Right/Left Hip Right;Left    Right Hip Flexion 4-/5    Right Hip Extension 3+/5    Right Hip External Rotation  4-/5    Right Hip Internal Rotation 4/5    Right Hip ABduction 3+/5    Right Hip ADduction 3+/5    Left Hip Flexion 4-/5    Left Hip Extension 3+/5    Left Hip External Rotation 4-/5    Left Hip Internal Rotation 4/5    Left Hip ABduction 3+/5    Left Hip ADduction 3+/5    Right/Left Knee Right;Left    Right Knee Flexion 4+/5    Right Knee Extension 5/5    Left Knee Flexion 4+/5    Left Knee Extension 5/5    Right/Left Ankle Right;Left    Right Ankle Dorsiflexion 4+/5    Left Ankle Dorsiflexion 4+/5      Flexibility   Soft Tissue Assessment /Muscle Length yes    Hamstrings mod tight B    Quadriceps very mild tight B    ITB mild/mod tight B    Piriformis mild tight B                      Objective measurements completed on examination: See above findings.       [facility] Adult PT Treatment/Exercise - 06/11/20 0804      Exercises   Exercises Lumbar      Lumbar Exercises: Stretches   Passive Hamstring Stretch Right;Left;30 seconds;1 rep    Passive Hamstring Stretch Limitations supine with strap    Lower Trunk Rotation 10 seconds;5 reps  ITB Stretch Right;Left;30 seconds;1 rep    ITB Stretch  Limitations supine cross-body with strap    Piriformis Stretch Right;Left;30 seconds;1 rep    Piriformis Stretch Limitations supine KTOS      Lumbar Exercises: Supine   Pelvic Tilt 10 reps;5 seconds                  PT Education - 06/11/20 0850    Education Details PT eval findings, anticipated POC & initial HEP    Person(s) Educated Patient    Methods Explanation;Demonstration;Verbal cues;Handout    Comprehension Verbalized understanding;Verbal cues required;Returned demonstration;Need further instruction            PT Short Term Goals - 06/11/20 0850      PT SHORT TERM GOAL #1   Title Patient will be independent with initial HEP    Status New    Target Date 07/02/20      PT SHORT TERM GOAL #2   Title Patient will verbalize/demonstrate understanding of neutral spine posture and proper body mechanics to reduce strain on lumbar spine    Status New    Target Date 07/02/20      PT SHORT TERM GOAL #3   Title Patient to report low back pain reduction in frequency and intensity by >/= 25%    Status New    Target Date 07/02/20             PT Long Term Goals - 06/11/20 0850      PT LONG TERM GOAL #1   Title Patient will be independent with ongoing/advanced HEP    Status New    Target Date 07/23/20      PT LONG TERM GOAL #2   Title Patient to demonstrate ability to achieve and maintain good spinal alignment/posturing    Status New    Target Date 07/23/20      PT LONG TERM GOAL #3   Title Patient to report reduction in frequency and intensity of LBP by >/= 50%    Status New    Target Date 07/23/20      PT LONG TERM GOAL #4   Title Patient to improve lumbar AROM to WFL/WNL without pain provocation    Status New    Target Date 07/23/20      PT LONG TERM GOAL #5   Title Patient will demonstrate improved B proximal LE strength to >/= 4 to 4+/5 for improved stability and ease of mobility    Status New    Target Date 07/23/20      PT LONG TERM GOAL #6    Title Patient to report ability to perform ADLs, household, and work-related tasks without increased pain    Status New    Target Date 07/23/20                  Plan - 06/11/20 0850    Clinical Impression Statement Sydney Stephens is a 49 y/o female who presents to OP PT for acute flareup of chronic low back with B sicatica on 05/03/20 when she felt her back slip while bending forward. She reports initial onset of LBP ~11 yr ago at which time PT and ESI helped alleviate the pain. Recent MRI on 05/22/20 reveals L4-L5 left foraminal/extraforaminal disc protrusion which results in moderate left foraminal and subarticular recess stenosis; L3-L4 moderate right subarticular recess stenosis, mild canal stenosis, and mild bilateral foraminal stenosis; and right eccentric degenerative disc disease at L3-L4. Deficits include decreased and painful lumbar ROM  for all motions except rotation, limited proximal LE flexibility, increased muscle tension with myofascial pain in lumbar paraspinals and upper gluteals, and mild to moderate proximal LE weakness with pain upon MMT resistance for all hip motions. Janelis will benefit from skilled PT to address above deficits to reduce myofascial pain and tightness and improve flexibility and core strength to decrease LBP pain and allow for increased participation in desired activities with decreased pain interference.    Personal Factors and Comorbidities Comorbidity 3+    Comorbidities Arthritis, thyroidectomy, GERD    Examination-Activity Limitations Bend;Caring for Others;Carry;Lift;Locomotion Level;Sit;Stand;Squat    Examination-Participation Restrictions Cleaning;Community Activity;Laundry;Meal Prep;Occupation    Stability/Clinical Decision Making Stable/Uncomplicated    Clinical Decision Making Low    Rehab Potential Good    PT Frequency 2x / week    PT Duration 6 weeks    PT Treatment/Interventions ADLs/Self Care Home Management;Cryotherapy;Electrical  Stimulation;Iontophoresis 4mg /ml Dexamethasone;Moist Heat;Traction;Ultrasound;Functional mobility training;Therapeutic activities;Therapeutic exercise;Neuromuscular re-education;Patient/family education;Manual techniques;Passive range of motion;Dry needling;Taping;Spinal Manipulations    PT Next Visit Plan Review initial HEP; posture & body mechanics education; lumbopelvic flexibility and strengthening; manual therapy and modalities PRN    PT Home Exercise Plan 11/8 - HS, ITB, piriformis & LTR stretches, pelvic tilt    Consulted and Agree with Plan of Care Patient           Patient will benefit from skilled therapeutic intervention in order to improve the following deficits and impairments:  Decreased activity tolerance, Decreased knowledge of precautions, Decreased mobility, Decreased range of motion, Decreased safety awareness, Decreased strength, Difficulty walking, Increased fascial restricitons, Increased muscle spasms, Impaired perceived functional ability, Impaired flexibility, Improper body mechanics, Postural dysfunction, Pain  Visit Diagnosis: Chronic bilateral low back pain with bilateral sciatica  Muscle spasm of back  Muscle weakness (generalized)     Problem List Patient Active Problem List   Diagnosis Date Noted  . Other intervertebral disc degeneration, lumbar region 05/25/2020  . Lumbar facet arthropathy 03/30/2019  . Headache 09/29/2017  . S/P total thyroidectomy 01/16/2017    Percival Spanish, PT, MPT 06/11/2020, 2:09 PM  [facility] [address]  [place], [place], [address] Phone: [phone]   Fax:  [phone]  Name: BREYANNA VALERA MRN: [id number] Date of Birth: 1971-03-28

## 2020-06-12 ENCOUNTER — Ambulatory Visit: Payer: 59

## 2020-06-12 DIAGNOSIS — M5441 Lumbago with sciatica, right side: Secondary | ICD-10-CM

## 2020-06-12 DIAGNOSIS — M6281 Muscle weakness (generalized): Secondary | ICD-10-CM

## 2020-06-12 DIAGNOSIS — M62830 Muscle spasm of back: Secondary | ICD-10-CM

## 2020-06-12 DIAGNOSIS — M5442 Lumbago with sciatica, left side: Secondary | ICD-10-CM | POA: Diagnosis not present

## 2020-06-12 DIAGNOSIS — G8929 Other chronic pain: Secondary | ICD-10-CM

## 2020-06-12 NOTE — Therapy (Signed)
[facility] [address]  [place], [place], [address] Phone: [phone]   Fax:  [phone]  Physical Therapy Treatment  Patient Details  Name: Sydney Stephens MRN: [id number] Date of Birth: 02-16-1971 Referring Provider (PT): Rodell Perna, MD   Encounter Date: 06/12/2020   PT End of Session - 06/12/20 1711    Visit Number 2    Number of Visits 12    Date for PT Re-Evaluation 07/23/20    Authorization Type UHC    PT Start Time 1701    PT Stop Time 1739    PT Time Calculation (min) 38 min    Activity Tolerance Patient tolerated treatment well    Behavior During Therapy [facility] for tasks assessed/performed           Past Medical History:  Diagnosis Date  . GERD (gastroesophageal reflux disease)   . Hypothyroidism   . PONV (postoperative nausea and vomiting)     Past Surgical History:  Procedure Laterality Date  . BREAST LUMPECTOMY    . LAPAROTOMY    . THYROIDECTOMY N/A 01/16/2017   Procedure: THYROIDECTOMY;  Surgeon: Izora Gala, MD;  Location: [place];  Service: ENT;  Laterality: N/A;  RNFA ([place])  . TONSILLECTOMY    . wisdom teeth      There were no vitals filed for this visit.   Subjective Assessment - 06/12/20 1708    Subjective Pt. doing ok    Diagnostic tests Lumbar MRI 05/22/20: 1. At L4-L5 there is a left foraminal/extraforaminal discprotrusion, which results in moderate left foraminal andsubarticular recess stenosis.2. At L3-L4 there is moderate right subarticular recess stenosis,mild canal stenosis, and mild bilateral foraminal stenosis.3. Right eccentric degenerative disc disease at L3-L4.    Patient Stated Goals "no pain"    Currently in Pain? No/denies    Pain Score 0-No pain    Pain Location Back    Pain Orientation Mid;Lower;Right;Left    Pain Descriptors / Indicators Sharp;Aching;Constant    Pain Type Acute pain    Pain Radiating Towards denies today; does have pain down back of legs to  calves after standing too long earlier today    Pain Onset More than a month ago    Pain Frequency Intermittent    Multiple Pain Sites No                             [facility] Adult PT Treatment/Exercise - 06/12/20 0001      Self-Care   Self-Care Other Self-Care Comments    Other Self-Care Comments  glute self-ball release to B buttocks on wall  (L more tender than R)      Lumbar Exercises: Stretches   Passive Hamstring Stretch Right;Left;30 seconds;1 rep    Passive Hamstring Stretch Limitations supine with strap    Lower Trunk Rotation 10 seconds;5 reps    ITB Stretch Right;Left;30 seconds;1 rep    ITB Stretch Limitations supine cross-body with strap    Piriformis Stretch Right;Left;30 seconds;1 rep    Piriformis Stretch Limitations supine KTOS      Lumbar Exercises: Aerobic   Nustep Lvl 4, 6 min (LE/UE)      Lumbar Exercises: Supine   Clam 10 reps;3 seconds    Clam Limitations red looped TB at knees     Bridge with clamshell 10 reps;3 seconds    Bridge with [employer] Limitations red looped TB at knees  Lumbar Exercises: Sidelying   Clam Right;Left;10 reps    Clam Limitations red looped TB at knees     Other Sidelying Lumbar Exercises B sidelying "open book" 5" x 10 reps                   PT Education - 06/12/20 1749    Education Details HEP update    Person(s) Educated Patient    Methods Explanation;Demonstration;Verbal cues;Handout    Comprehension Verbalized understanding;Returned demonstration;Verbal cues required            PT Short Term Goals - 06/12/20 1712      PT SHORT TERM GOAL #1   Title Patient will be independent with initial HEP    Status On-going    Target Date 07/02/20      PT SHORT TERM GOAL #2   Title Patient will verbalize/demonstrate understanding of neutral spine posture and proper body mechanics to reduce strain on lumbar spine    Status On-going    Target Date 07/02/20      PT SHORT TERM GOAL #3   Title  Patient to report low back pain reduction in frequency and intensity by >/= 25%    Status On-going    Target Date 07/02/20             PT Long Term Goals - 06/12/20 1712      PT LONG TERM GOAL #1   Title Patient will be independent with ongoing/advanced HEP    Status On-going      PT LONG TERM GOAL #2   Title Patient to demonstrate ability to achieve and maintain good spinal alignment/posturing    Status On-going      PT LONG TERM GOAL #3   Title Patient to report reduction in frequency and intensity of LBP by >/= 50%    Status On-going      PT LONG TERM GOAL #4   Title Patient to improve lumbar AROM to WFL/WNL without pain provocation    Status On-going      PT LONG TERM GOAL #5   Title Patient will demonstrate improved B proximal LE strength to >/= 4 to 4+/5 for improved stability and ease of mobility    Status On-going      PT LONG TERM GOAL #6   Title Patient to report ability to perform ADLs, household, and work-related tasks without increased pain    Status On-going                 Plan - 06/12/20 1713    Clinical Impression Statement Sydney Stephens noting she has not yet attempted HEP activities however only just received the HEP handout yesterday at PT session.  Reviewed to check for proper technique with HEP activities with good overall demo with exception of minor adjustment in angle off pull with B ITB stretch supine with strap.  Trialed ball self-massage to buttocks (L more tender than R) leaning on wall with pt. noting good benefit thus updated HEP with this activity and bridge (see pt. education section).  Will monitor tolerance to updated HEP in coming session.    Comorbidities Arthritis, thyroidectomy, GERD    Rehab Potential Good    PT Frequency 2x / week    PT Duration 6 weeks    PT Treatment/Interventions ADLs/Self Care Home Management;Cryotherapy;Electrical Stimulation;Iontophoresis 4mg /ml Dexamethasone;Moist Heat;Traction;Ultrasound;Functional mobility  training;Therapeutic activities;Therapeutic exercise;Neuromuscular re-education;Patient/family education;Manual techniques;Passive range of motion;Dry needling;Taping;Spinal Manipulations    PT Next Visit Plan Posture & body mechanics education;  lumbopelvic flexibility and strengthening; manual therapy and modalities PRN    PT Home Exercise Plan 11/8 - HS, ITB, piriformis & LTR stretches, pelvic tilt    Consulted and Agree with Plan of Care Patient           Patient will benefit from skilled therapeutic intervention in order to improve the following deficits and impairments:  Decreased activity tolerance, Decreased knowledge of precautions, Decreased mobility, Decreased range of motion, Decreased safety awareness, Decreased strength, Difficulty walking, Increased fascial restricitons, Increased muscle spasms, Impaired perceived functional ability, Impaired flexibility, Improper body mechanics, Postural dysfunction, Pain  Visit Diagnosis: Chronic bilateral low back pain with bilateral sciatica  Muscle spasm of back  Muscle weakness (generalized)     Problem List Patient Active Problem List   Diagnosis Date Noted  . Other intervertebral disc degeneration, lumbar region 05/25/2020  . Lumbar facet arthropathy 03/30/2019  . Headache 09/29/2017  . S/P total thyroidectomy 01/16/2017    Bess Harvest, PTA 06/12/20 5:55 PM   [facility] [address]  [place], [place], [address] Phone: [phone]   Fax:  [phone]  Name: Sydney Stephens MRN: [id number] Date of Birth: 1971/04/08

## 2020-06-18 ENCOUNTER — Ambulatory Visit: Payer: 59

## 2020-06-18 ENCOUNTER — Other Ambulatory Visit: Payer: Self-pay

## 2020-06-18 DIAGNOSIS — G8929 Other chronic pain: Secondary | ICD-10-CM

## 2020-06-18 DIAGNOSIS — M6281 Muscle weakness (generalized): Secondary | ICD-10-CM

## 2020-06-18 DIAGNOSIS — M5442 Lumbago with sciatica, left side: Secondary | ICD-10-CM | POA: Diagnosis not present

## 2020-06-18 DIAGNOSIS — M62830 Muscle spasm of back: Secondary | ICD-10-CM

## 2020-06-18 NOTE — Patient Instructions (Addendum)

## 2020-06-18 NOTE — Therapy (Signed)
[facility] [address]  [place], [place], [address] Phone: [phone]   Fax:  [phone]  Physical Therapy Treatment  Patient Details  Name: Sydney Stephens MRN: [id number] Date of Birth: September 14, 1970 Referring Provider (PT): Rodell Perna, MD   Encounter Date: 06/18/2020   PT End of Session - 06/18/20 0806    Visit Number 3    Number of Visits 12    Date for PT Re-Evaluation 07/23/20    Authorization Type UHC    PT Start Time 0802    PT Stop Time 0856    PT Time Calculation (min) 54 min    Activity Tolerance Patient tolerated treatment well    Behavior During Therapy [facility] for tasks assessed/performed           Past Medical History:  Diagnosis Date  . GERD (gastroesophageal reflux disease)   . Hypothyroidism   . PONV (postoperative nausea and vomiting)     Past Surgical History:  Procedure Laterality Date  . BREAST LUMPECTOMY    . LAPAROTOMY    . THYROIDECTOMY N/A 01/16/2017   Procedure: THYROIDECTOMY;  Surgeon: Izora Gala, MD;  Location: [place];  Service: ENT;  Laterality: N/A;  RNFA ([place])  . TONSILLECTOMY    . wisdom teeth      There were no vitals filed for this visit.   Subjective Assessment - 06/18/20 0805    Subjective Pt. reporting she was able to drive to the beach and shop without too much pain.  Did have some buttocks pain on drive however.    Diagnostic tests Lumbar MRI 05/22/20: 1. At L4-L5 there is a left foraminal/extraforaminal discprotrusion, which results in moderate left foraminal andsubarticular recess stenosis.2. At L3-L4 there is moderate right subarticular recess stenosis,mild canal stenosis, and mild bilateral foraminal stenosis.3. Right eccentric degenerative disc disease at L3-L4.    Patient Stated Goals "no pain"    Currently in Pain? No/denies    Pain Score 0-No pain    Pain Location Back                             [facility] Adult PT Treatment/Exercise -  06/18/20 0001      Self-Care   Self-Care Other Self-Care Comments    Other Self-Care Comments  instruction in proper posture and body mechanics with daily activities to reduce lumbar strain       Lumbar Exercises: Aerobic   Nustep Lvl 4, 6 min (LE/UE)      Lumbar Exercises: Standing   Other Standing Lumbar Exercises Alternating glute med 45 dg x 10 reps     Other Standing Lumbar Exercises Alternating hip abduction standing at counter x 10       Lumbar Exercises: Sidelying   Clam Right;Left;10 reps    Clam Limitations red looped TB at knees     Other Sidelying Lumbar Exercises B sidelying "open book" 5" x 10 reps       Lumbar Exercises: Quadruped   [place] Horse 10 reps    Straight Leg Raise 10 reps;2 seconds    Straight Leg Raises Limitations cues to avoid excessive lumbar extension       Modalities   Modalities Moist Heat      Moist Heat Therapy   Number Minutes Moist Heat 10 Minutes    Moist Heat Location Lumbar Spine   and B buttocks in hooklying      Manual  Therapy   Manual Therapy Soft tissue mobilization;Myofascial release    Manual therapy comments sidelying     Soft tissue mobilization STM/DTM to L glute med, glute max. piri - ttp     Myofascial Release TPR to L glute med, piri                  PT Education - 06/18/20 0936    Education Details HEP update; clam shell red TB, quadruped leg raise, standing hip abd    Person(s) Educated Patient    Methods Explanation;Verbal cues;Handout;Tactile cues    Comprehension Verbalized understanding;Returned demonstration;Verbal cues required            PT Short Term Goals - 06/12/20 1712      PT SHORT TERM GOAL #1   Title Patient will be independent with initial HEP    Status On-going    Target Date 07/02/20      PT SHORT TERM GOAL #2   Title Patient will verbalize/demonstrate understanding of neutral spine posture and proper body mechanics to reduce strain on lumbar spine    Status On-going    Target  Date 07/02/20      PT SHORT TERM GOAL #3   Title Patient to report low back pain reduction in frequency and intensity by >/= 25%    Status On-going    Target Date 07/02/20             PT Long Term Goals - 06/12/20 1712      PT LONG TERM GOAL #1   Title Patient will be independent with ongoing/advanced HEP    Status On-going      PT LONG TERM GOAL #2   Title Patient to demonstrate ability to achieve and maintain good spinal alignment/posturing    Status On-going      PT LONG TERM GOAL #3   Title Patient to report reduction in frequency and intensity of LBP by >/= 50%    Status On-going      PT LONG TERM GOAL #4   Title Patient to improve lumbar AROM to WFL/WNL without pain provocation    Status On-going      PT LONG TERM GOAL #5   Title Patient will demonstrate improved B proximal LE strength to >/= 4 to 4+/5 for improved stability and ease of mobility    Status On-going      PT LONG TERM GOAL #6   Title Patient to report ability to perform ADLs, household, and work-related tasks without increased pain    Status On-going                 Plan - 06/18/20 0806    Clinical Impression Statement Holli reporting she had some hip pain driving to [facility] however was able to shop and walk without significant pain over weekend.  MT addressed increased tension/ttp in L buttocks musculature with some relief noted and improved tolerance for lumbopelvic ROM activities afterward.  Progressed proximal hip strengthening activities to address strength deficits revealed at eval. along with instructing pt. in proper posture and body mechanics with daily activities to reduce lumbar strain.  Progressing toward STGs.    Comorbidities Arthritis, thyroidectomy, GERD    Rehab Potential Good    PT Frequency 2x / week    PT Duration 6 weeks    PT Treatment/Interventions ADLs/Self Care Home Management;Cryotherapy;Electrical Stimulation;Iontophoresis 4mg /ml Dexamethasone;Moist  Heat;Traction;Ultrasound;Functional mobility training;Therapeutic activities;Therapeutic exercise;Neuromuscular re-education;Patient/family education;Manual techniques;Passive range of motion;Dry needling;Taping;Spinal Manipulations    PT  Next Visit Plan Lumbopelvic flexibility and strengthening; manual therapy and modalities PRN    PT Home Exercise Plan 11/8 - HS, ITB, piriformis & LTR stretches, pelvic tilt; bridge/abd red TB, glute self-massage on wall; 06/18/20 - quadruped LE raise, clam shell red TB, standing hip abd    Consulted and Agree with Plan of Care Patient           Patient will benefit from skilled therapeutic intervention in order to improve the following deficits and impairments:  Decreased activity tolerance, Decreased knowledge of precautions, Decreased mobility, Decreased range of motion, Decreased safety awareness, Decreased strength, Difficulty walking, Increased fascial restricitons, Increased muscle spasms, Impaired perceived functional ability, Impaired flexibility, Improper body mechanics, Postural dysfunction, Pain  Visit Diagnosis: Chronic bilateral low back pain with bilateral sciatica  Muscle spasm of back  Muscle weakness (generalized)     Problem List Patient Active Problem List   Diagnosis Date Noted  . Other intervertebral disc degeneration, lumbar region 05/25/2020  . Lumbar facet arthropathy 03/30/2019  . Headache 09/29/2017  . S/P total thyroidectomy 01/16/2017    Bess Harvest, PTA 06/18/20 9:38 AM   [facility] [address]  [place], [place], [address] Phone: [phone]   Fax:  [phone]  Name: MIKENA MASONER MRN: [id number] Date of Birth: 21-Aug-1970

## 2020-06-19 ENCOUNTER — Other Ambulatory Visit: Payer: Self-pay | Admitting: Orthopaedic Surgery

## 2020-06-19 NOTE — Telephone Encounter (Signed)
Please advise 

## 2020-06-20 ENCOUNTER — Other Ambulatory Visit: Payer: Self-pay

## 2020-06-20 ENCOUNTER — Ambulatory Visit: Payer: 59

## 2020-06-20 DIAGNOSIS — G8929 Other chronic pain: Secondary | ICD-10-CM

## 2020-06-20 DIAGNOSIS — M6281 Muscle weakness (generalized): Secondary | ICD-10-CM

## 2020-06-20 DIAGNOSIS — M5442 Lumbago with sciatica, left side: Secondary | ICD-10-CM

## 2020-06-20 DIAGNOSIS — M62830 Muscle spasm of back: Secondary | ICD-10-CM

## 2020-06-20 NOTE — Therapy (Signed)
[facility] [address]  [place], [place], [address] Phone: [phone]   Fax:  [phone]  Physical Therapy Treatment  Patient Details  Name: Sydney Stephens MRN: [id number] Date of Birth: 1970/09/24 Referring Provider (PT): Rodell Perna, MD   Encounter Date: 06/20/2020   PT End of Session - 06/20/20 0814    Visit Number 4    Number of Visits 12    Date for PT Re-Evaluation 07/23/20    Authorization Type UHC    PT Start Time 0801    PT Stop Time 0850    PT Time Calculation (min) 49 min    Activity Tolerance Patient tolerated treatment well    Behavior During Therapy [facility] for tasks assessed/performed           Past Medical History:  Diagnosis Date  . GERD (gastroesophageal reflux disease)   . Hypothyroidism   . PONV (postoperative nausea and vomiting)     Past Surgical History:  Procedure Laterality Date  . BREAST LUMPECTOMY    . LAPAROTOMY    . THYROIDECTOMY N/A 01/16/2017   Procedure: THYROIDECTOMY;  Surgeon: Izora Gala, MD;  Location: [place];  Service: ENT;  Laterality: N/A;  RNFA ([place])  . TONSILLECTOMY    . wisdom teeth      There were no vitals filed for this visit.   Subjective Assessment - 06/20/20 0804    Subjective Pt. reporting she tried sleeping on her back with a pillow under her knees and was able to wake up without pain.    Diagnostic tests Lumbar MRI 05/22/20: 1. At L4-L5 there is a left foraminal/extraforaminal discprotrusion, which results in moderate left foraminal andsubarticular recess stenosis.2. At L3-L4 there is moderate right subarticular recess stenosis,mild canal stenosis, and mild bilateral foraminal stenosis.3. Right eccentric degenerative disc disease at L3-L4.    Patient Stated Goals "no pain"    Currently in Pain? No/denies    Pain Score 0-No pain   back pain rising to 3/10 at most   Pain Location Back    Pain Orientation Mid;Lower;Right;Left    Pain Descriptors /  Indicators Sharp;Aching;Constant    Pain Type Acute pain                             [facility] Adult PT Treatment/Exercise - 06/20/20 0001      Lumbar Exercises: Stretches   Lower Trunk Rotation 10 seconds;5 reps   Pt. noting good stretch with this activity in area oftension   Lumbar Stabilization Level 1 2 reps;20 seconds    Lumbar Stabilization Level 1 Limitations childs pose lumbar stretch for L lower back       Lumbar Exercises: Aerobic   Recumbent Bike Lvl 2, 6 min       Lumbar Exercises: Sidelying   Other Sidelying Lumbar Exercises B sidelying "open book" 5" x 10 reps    Pt. noting good stretch with this activity in area oftension     Lumbar Exercises: Quadruped   Opposite Arm/Leg Raise Right arm/Left leg;Left arm/Right leg;10 reps    Opposite Arm/Leg Raise Limitations cues for slow performance       Manual Therapy   Manual Therapy Soft tissue mobilization;Myofascial release;Taping    Manual therapy comments sidelying     Soft tissue mobilization STM/DTM to L glute med, glute max. piri, B lumbar paraspinals  - ttp     Myofascial Release TPR to L  glute med, piri, L rhomboids - palpable TP in L glute med and rhomboids     [employer] IT sales professional B lumbar K-taping along paraspinals from mid thoracic to upper buttocks (30% stretch); single horizontal strip running along SIJ level (30% stretch )   K-taping                  PT Education - 06/20/20 1224    Education Details DN edu handout; K-taping edu handout    Person(s) Educated Patient    Methods Explanation;Verbal cues;Handout    Comprehension Verbalized understanding;Verbal cues required            PT Short Term Goals - 06/20/20 0815      PT SHORT TERM GOAL #1   Title Patient will be independent with initial HEP    Status Achieved    Target Date 07/02/20      PT SHORT TERM GOAL #2   Title Patient will verbalize/demonstrate understanding of neutral spine  posture and proper body mechanics to reduce strain on lumbar spine    Status Achieved   06/20/20:  Pt. has made adjustment to her sleeping and desk positioning with good understanding of neutral spine posture   Target Date 07/02/20      PT SHORT TERM GOAL #3   Title Patient to report low back pain reduction in frequency and intensity by >/= 25%    Status Partially Met   06/20/20: Met for frequency not yet for intensity   Target Date 07/02/20             PT Long Term Goals - 06/12/20 1712      PT LONG TERM GOAL #1   Title Patient will be independent with ongoing/advanced HEP    Status On-going      PT LONG TERM GOAL #2   Title Patient to demonstrate ability to achieve and maintain good spinal alignment/posturing    Status On-going      PT LONG TERM GOAL #3   Title Patient to report reduction in frequency and intensity of LBP by >/= 50%    Status On-going      PT LONG TERM GOAL #4   Title Patient to improve lumbar AROM to WFL/WNL without pain provocation    Status On-going      PT LONG TERM GOAL #5   Title Patient will demonstrate improved B proximal LE strength to >/= 4 to 4+/5 for improved stability and ease of mobility    Status On-going      PT LONG TERM GOAL #6   Title Patient to report ability to perform ADLs, household, and work-related tasks without increased pain    Status On-going                 Plan - 06/20/20 0805    Clinical Impression Statement Pt. reporting less frequent pain since starting PT.  Tried sleeping on back with a pillow under her knees last night which allowed her to wake up pain free.  She notes, "PT is working".  Pt. has met STG #1, and STG #2 as she denies questions regarding initial HEP and has demo'd good understanding of neutral spinal positioning to reduce back strain.  Pt. has now adjusted her desk sitting positioning (step under feet) and sleeping positioning (pillow under knees) for reduced lumbar pain.  MT revealed tension/TP in L  glute Medius and L rhomboids today.  DN education  handout issued to pt. today as pt. inquiring about this possible treatment.  Trailed K-taping to lumbar paraspinals for reduction in tension today with education handout on K-taping issued to pt.  Pt. to see supervising PT at upcoming visit.    Comorbidities Arthritis, thyroidectomy, GERD    Rehab Potential Good    PT Frequency 2x / week    PT Duration 6 weeks    PT Treatment/Interventions ADLs/Self Care Home Management;Cryotherapy;Electrical Stimulation;Iontophoresis 85m/ml Dexamethasone;Moist Heat;Traction;Ultrasound;Functional mobility training;Therapeutic activities;Therapeutic exercise;Neuromuscular re-education;Patient/family education;Manual techniques;Passive range of motion;Dry needling;Taping;Spinal Manipulations    PT Next Visit Plan Possible DN to L glute medius, L rhomboids; Lumbopelvic flexibility and strengthening; manual therapy and modalities PRN    PT Home Exercise Plan 11/8 - HS, ITB, piriformis & LTR stretches, pelvic tilt; bridge/abd red TB, glute self-massage on wall; 06/18/20 - quadruped LE raise, clam shell red TB, standing hip abd    Consulted and Agree with Plan of Care Patient           Patient will benefit from skilled therapeutic intervention in order to improve the following deficits and impairments:  Decreased activity tolerance, Decreased knowledge of precautions, Decreased mobility, Decreased range of motion, Decreased safety awareness, Decreased strength, Difficulty walking, Increased fascial restricitons, Increased muscle spasms, Impaired perceived functional ability, Impaired flexibility, Improper body mechanics, Postural dysfunction, Pain  Visit Diagnosis: Chronic bilateral low back pain with bilateral sciatica  Muscle spasm of back  Muscle weakness (generalized)     Problem List Patient Active Problem List   Diagnosis Date Noted  . Other intervertebral disc degeneration, lumbar region 05/25/2020  .  Lumbar facet arthropathy 03/30/2019  . Headache 09/29/2017  . S/P total thyroidectomy 01/16/2017    MBess Harvest PTA 06/20/20 12:25 PM   [facility] [address] [place] [address]: [phone]  Fax:  [phone] Name: RELLEY HARPMRN: [id number] of Birth: 101-Nov-1972

## 2020-06-20 NOTE — Patient Instructions (Signed)

## 2020-06-25 ENCOUNTER — Other Ambulatory Visit: Payer: Self-pay

## 2020-06-25 ENCOUNTER — Encounter: Payer: Self-pay | Admitting: Physical Therapy

## 2020-06-25 ENCOUNTER — Ambulatory Visit: Payer: 59 | Admitting: Physical Therapy

## 2020-06-25 DIAGNOSIS — M62830 Muscle spasm of back: Secondary | ICD-10-CM

## 2020-06-25 DIAGNOSIS — M5442 Lumbago with sciatica, left side: Secondary | ICD-10-CM | POA: Diagnosis not present

## 2020-06-25 DIAGNOSIS — M6281 Muscle weakness (generalized): Secondary | ICD-10-CM

## 2020-06-25 DIAGNOSIS — G8929 Other chronic pain: Secondary | ICD-10-CM

## 2020-06-25 NOTE — Therapy (Signed)
[facility] [address]  [place], [place], [address] Phone: [phone]   Fax:  [phone]  Physical Therapy Treatment  Patient Details  Name: Sydney Stephens MRN: [id number] Date of Birth: January 12, 1971 Referring Provider (PT): Rodell Perna, MD   Encounter Date: 06/25/2020   PT End of Session - 06/25/20 1701    Visit Number 5    Number of Visits 12    Date for PT Re-Evaluation 07/23/20    Authorization Type UHC    PT Start Time 1701    PT Stop Time 1748    PT Time Calculation (min) 47 min    Activity Tolerance Patient tolerated treatment well    Behavior During Therapy [facility] for tasks assessed/performed           Past Medical History:  Diagnosis Date  . GERD (gastroesophageal reflux disease)   . Hypothyroidism   . PONV (postoperative nausea and vomiting)     Past Surgical History:  Procedure Laterality Date  . BREAST LUMPECTOMY    . LAPAROTOMY    . THYROIDECTOMY N/A 01/16/2017   Procedure: THYROIDECTOMY;  Surgeon: Izora Gala, MD;  Location: [place];  Service: ENT;  Laterality: N/A;  RNFA ([place])  . TONSILLECTOMY    . wisdom teeth      There were no vitals filed for this visit.   Subjective Assessment - 06/25/20 1703    Subjective Pt denies pain currently but did note her L hip was botherign her earlier today.    Diagnostic tests Lumbar MRI 05/22/20: 1. At L4-L5 there is a left foraminal/extraforaminal discprotrusion, which results in moderate left foraminal andsubarticular recess stenosis.2. At L3-L4 there is moderate right subarticular recess stenosis,mild canal stenosis, and mild bilateral foraminal stenosis.3. Right eccentric degenerative disc disease at L3-L4.    Patient Stated Goals "no pain"    Currently in Pain? No/denies    Pain Score 0-No pain    Pain Location Back    Pain Orientation Lower    Pain Type Acute pain    Pain Radiating Towards pain more centralized to hips and low back recently                               [facility] Adult PT Treatment/Exercise - 06/25/20 1701      Exercises   Exercises Lumbar      Lumbar Exercises: Aerobic   Nustep L5 x 6 min      Manual Therapy   Manual Therapy Soft tissue mobilization;Myofascial release;Taping    Manual therapy comments skilled palpation and monitoring during DN    Soft tissue mobilization STM/DTM to L glute medius/minumus & piriformis; L UT & LS    Myofascial Release manual TPR to to L glute medius/minumus & piriformis; L UT & LS      [employer]   Create Space B lumbar paraspinals - 30% from glutes to lower/mid thoracic paraspinals + 30-50% perpendicular strip at level of PSIS   Rock tape           Trigger Point Dry Needling - 06/25/20 1701    Consent Given? Yes    Education Handout Provided Previously provided    Muscles Treated Head and Neck Upper trapezius;Levator scapulae   Lt   Muscles Treated Back/Hip Gluteus minimus;Gluteus medius;Piriformis   Lt   Upper Trapezius Response Twitch reponse elicited;Palpable increased muscle length    Levator Scapulae Response Twitch response  elicited;Palpable increased muscle length    Gluteus Minimus Response Twitch response elicited;Palpable increased muscle length    Gluteus Medius Response Twitch response elicited;Palpable increased muscle length    Piriformis Response Twitch response elicited;Palpable increased muscle length                PT Education - 06/25/20 1714    Education Details DN rational, procedures, outcomes, potential side effects, and recommended post-treatment exercises/activity    Person(s) Educated Patient    Methods Explanation;Handout   handout previously provided   Comprehension Verbalized understanding            PT Short Term Goals - 06/25/20 1705      PT SHORT TERM GOAL #1   Title Patient will be independent with initial HEP    Status Achieved   06/20/20     PT SHORT TERM GOAL #2   Title Patient will  verbalize/demonstrate understanding of neutral spine posture and proper body mechanics to reduce strain on lumbar spine    Status Achieved   06/20/20 - Pt has made adjustment to her sleeping and desk positioning with good understanding of neutral spine posture     PT SHORT TERM GOAL #3   Title Patient to report low back pain reduction in frequency and intensity by >/= 25%    Status Achieved   06/25/20 - pt reports 30% reduction in pain & intensity with pain now more centralized            PT Long Term Goals - 06/25/20 1707      PT LONG TERM GOAL #1   Title Patient will be independent with ongoing/advanced HEP    Status On-going    Target Date 07/23/20      PT LONG TERM GOAL #2   Title Patient to demonstrate ability to achieve and maintain good spinal alignment/posturing    Status On-going    Target Date 07/23/20      PT LONG TERM GOAL #3   Title Patient to report reduction in frequency and intensity of LBP by >/= 50%    Status On-going    Target Date 07/23/20      PT LONG TERM GOAL #4   Title Patient to improve lumbar AROM to WFL/WNL without pain provocation    Status On-going    Target Date 07/23/20      PT LONG TERM GOAL #5   Title Patient will demonstrate improved B proximal LE strength to >/= 4 to 4+/5 for improved stability and ease of mobility    Status On-going    Target Date 07/23/20      PT LONG TERM GOAL #6   Title Patient to report ability to perform ADLs, household, and work-related tasks without increased pain    Status On-going    Target Date 07/23/20                 Plan - 06/25/20 1708    Clinical Impression Statement [name] reports overall reduction in her LBP frequency and intensity by 30% thus far with PT and notes centralization of radicular pain to primarily buttock level at this point. Ongoing tightness and ttp present in L upper glutes and piriformis which appeared amenable to DN with pt also noting TP in L medial upper periscapular area.  After explanation of DN rational, procedures, outcomes and potential side effects, patient verbalized consent to DN treatment in conjunction with manual STM/DTM and TPR to reduce ttp/muscle tension. Muscles treated include L glute  medius/minimus and piriformis along with L UT and LS. DN produced normal response with good twitches elicited resulting in palpable reduction in pain/ttp and muscle tension. Pt educated to expect mild to moderate muscle soreness for up to 24-48 hrs and instructed to continue prescribed home exercise program and current activity level with pt verbalizing understanding of theses instructions. Kinesiotape reapplied as pt noting good response from initial taping.    Comorbidities Arthritis, thyroidectomy, GERD    Rehab Potential Good    PT Frequency 2x / week    PT Duration 6 weeks    PT Treatment/Interventions ADLs/Self Care Home Management;Cryotherapy;Electrical Stimulation;Iontophoresis 4mg /ml Dexamethasone;Moist Heat;Traction;Ultrasound;Functional mobility training;Therapeutic activities;Therapeutic exercise;Neuromuscular re-education;Patient/family education;Manual techniques;Passive range of motion;Dry needling;Taping;Spinal Manipulations    PT Next Visit Plan assess response to DN; lumbopelvic flexibility and strengthening; manual therapy and modalities PRN    PT Home Exercise Plan 11/8 - HS, ITB, piriformis & LTR stretches, pelvic tilt; bridge/abd red TB, glute self-massage on wall; 11/15 - quadruped LE raise, clam shell red TB, standing hip abd    Consulted and Agree with Plan of Care Patient           Patient will benefit from skilled therapeutic intervention in order to improve the following deficits and impairments:  Decreased activity tolerance, Decreased knowledge of precautions, Decreased mobility, Decreased range of motion, Decreased safety awareness, Decreased strength, Difficulty walking, Increased fascial restricitons, Increased muscle spasms, Impaired  perceived functional ability, Impaired flexibility, Improper body mechanics, Postural dysfunction, Pain  Visit Diagnosis: Chronic bilateral low back pain with bilateral sciatica  Muscle spasm of back  Muscle weakness (generalized)     Problem List Patient Active Problem List   Diagnosis Date Noted  . Other intervertebral disc degeneration, lumbar region 05/25/2020  . Lumbar facet arthropathy 03/30/2019  . Headache 09/29/2017  . S/P total thyroidectomy 01/16/2017    Percival Spanish, PT, MPT 06/25/2020, 6:16 PM  [facility] [address]  [place], [place], [address] Phone: [phone]   Fax:  [phone]  Name: Sydney Stephens MRN: [id number] Date of Birth: 04/13/1971

## 2020-06-27 ENCOUNTER — Other Ambulatory Visit: Payer: Self-pay

## 2020-06-27 ENCOUNTER — Ambulatory Visit: Payer: 59

## 2020-06-27 DIAGNOSIS — M5442 Lumbago with sciatica, left side: Secondary | ICD-10-CM | POA: Diagnosis not present

## 2020-06-27 DIAGNOSIS — M6281 Muscle weakness (generalized): Secondary | ICD-10-CM

## 2020-06-27 DIAGNOSIS — M5441 Lumbago with sciatica, right side: Secondary | ICD-10-CM

## 2020-06-27 DIAGNOSIS — G8929 Other chronic pain: Secondary | ICD-10-CM

## 2020-06-27 DIAGNOSIS — M62830 Muscle spasm of back: Secondary | ICD-10-CM

## 2020-06-27 NOTE — Therapy (Signed)
[facility] [address]  [place], [place], [address] Phone: [phone]   Fax:  [phone]  Physical Therapy Treatment  Patient Details  Name: Sydney Stephens MRN: [id number] Date of Birth: 29-Oct-1970 Referring Provider (PT): Rodell Perna, MD   Encounter Date: 06/27/2020   PT End of Session - 06/27/20 0853    Visit Number 6    Number of Visits 12    Date for PT Re-Evaluation 07/23/20    Authorization Type UHC    PT Start Time 0846    PT Stop Time 0931    PT Time Calculation (min) 45 min    Activity Tolerance Patient tolerated treatment well    Behavior During Therapy WFL for tasks assessed/performed           Past Medical History:  Diagnosis Date   GERD (gastroesophageal reflux disease)    Hypothyroidism    PONV (postoperative nausea and vomiting)     Past Surgical History:  Procedure Laterality Date   BREAST LUMPECTOMY     LAPAROTOMY     THYROIDECTOMY N/A 01/16/2017   Procedure: THYROIDECTOMY;  Surgeon: Izora Gala, MD;  Location: [place];  Service: ENT;  Laterality: N/A;  RNFA ([facility])   TONSILLECTOMY     wisdom teeth      There were no vitals filed for this visit.   Subjective Assessment - 06/27/20 0850    Subjective Pt. denies pain currently however notes L posterior/lateral hip pain rises to 3/10 when it does hurt.  Does feel like she had some instant relief after DN last session.    Diagnostic tests Lumbar MRI 05/22/20: 1. At L4-L5 there is a left foraminal/extraforaminal discprotrusion, which results in moderate left foraminal andsubarticular recess stenosis.2. At L3-L4 there is moderate right subarticular recess stenosis,mild canal stenosis, and mild bilateral foraminal stenosis.3. Right eccentric degenerative disc disease at L3-L4.    Patient Stated Goals "no pain"    Currently in Pain? No/denies    Pain Score 0-No pain   Pain up to 3-4/10 at most   Pain Location Hip    Pain  Orientation Lower;Left;Posterior;Lateral    Pain Descriptors / Indicators Aching;Sharp    Pain Type Acute pain    Pain Radiating Towards continues with pain more centralized to hips and low back recently    Pain Onset More than a month ago    Pain Frequency Intermittent    Aggravating Factors  prolonged sitting or standing    Multiple Pain Sites No                             [facility] Adult PT Treatment/Exercise - 06/27/20 0001      Lumbar Exercises: Stretches   Passive Hamstring Stretch Left;1 rep;30 seconds    Passive Hamstring Stretch Limitations manual with therapist     Single Knee to Chest Stretch Left;1 rep;30 seconds    Single Knee to Chest Stretch Limitations manual with therapist     Lower Trunk Rotation Limitations 5" x 10 reps     Piriformis Stretch Left;1 rep;30 seconds    Piriformis Stretch Limitations KTOS - manual       Lumbar Exercises: Aerobic   Nustep L5 x 6 min (UE/LE)      Lumbar Exercises: Standing   Functional Squats 10 reps;3 seconds    Wall Slides 10 reps;3 seconds    Wall Slides Limitations leaning on green p-ball  Lumbar Exercises: Supine   Bridge with clamshell 15 reps;3 seconds   + B isometric hip abd/ER into green TB at knees      Lumbar Exercises: Sidelying   Clam Left   x 12   Clam Limitations green TB at knees     Hip Abduction Left;15 reps    Hip Abduction Limitations 45 dg glute med sidelying SLR      Lumbar Exercises: Quadruped   Opposite Arm/Leg Raise 15 reps;Left arm/Right leg;Right arm/Left leg                  PT Education - 06/27/20 1154    Education Details HEP update; counter squat    Person(s) Educated Patient    Methods Explanation;Demonstration;Verbal cues;Handout    Comprehension Verbalized understanding;Returned demonstration;Verbal cues required            PT Short Term Goals - 06/25/20 1705      PT SHORT TERM GOAL #1   Title Patient will be independent with initial HEP    Status  Achieved   06/20/20     PT SHORT TERM GOAL #2   Title Patient will verbalize/demonstrate understanding of neutral spine posture and proper body mechanics to reduce strain on lumbar spine    Status Achieved   06/20/20 - Pt has made adjustment to her sleeping and desk positioning with good understanding of neutral spine posture     PT SHORT TERM GOAL #3   Title Patient to report low back pain reduction in frequency and intensity by >/= 25%    Status Achieved   06/25/20 - pt reports 30% reduction in pain & intensity with pain now more centralized            PT Long Term Goals - 06/25/20 1707      PT LONG TERM GOAL #1   Title Patient will be independent with ongoing/advanced HEP    Status On-going    Target Date 07/23/20      PT LONG TERM GOAL #2   Title Patient to demonstrate ability to achieve and maintain good spinal alignment/posturing    Status On-going    Target Date 07/23/20      PT LONG TERM GOAL #3   Title Patient to report reduction in frequency and intensity of LBP by >/= 50%    Status On-going    Target Date 07/23/20      PT LONG TERM GOAL #4   Title Patient to improve lumbar AROM to WFL/WNL without pain provocation    Status On-going    Target Date 07/23/20      PT LONG TERM GOAL #5   Title Patient will demonstrate improved B proximal LE strength to >/= 4 to 4+/5 for improved stability and ease of mobility    Status On-going    Target Date 07/23/20      PT LONG TERM GOAL #6   Title Patient to report ability to perform ADLs, household, and work-related tasks without increased pain    Status On-going    Target Date 07/23/20                 Plan - 06/27/20 0858    Clinical Impression Statement Nayra noting good benefit from DN to L hip last session noting "instant relief" in some areas at her hip.  Denies pain to start session and MT revealing some improvement in tension/tenderness at L hip musculature despite addressing L piriformis tension.  Progressed  hip/LE strengthening program  for duration of session with wall squats, counter squats, glute med SLR and good tolerance.    Comorbidities Arthritis, thyroidectomy, GERD    Rehab Potential Good    PT Frequency 2x / week    PT Duration 6 weeks    PT Treatment/Interventions ADLs/Self Care Home Management;Cryotherapy;Electrical Stimulation;Iontophoresis 4mg /ml Dexamethasone;Moist Heat;Traction;Ultrasound;Functional mobility training;Therapeutic activities;Therapeutic exercise;Neuromuscular re-education;Patient/family education;Manual techniques;Passive range of motion;Dry needling;Taping;Spinal Manipulations    PT Next Visit Plan Lumbopelvic flexibility and strengthening; manual therapy and modalities PRN    PT Home Exercise Plan 11/8 - HS, ITB, piriformis & LTR stretches, pelvic tilt; bridge/abd red TB, glute self-massage on wall; 11/15 - quadruped LE raise, clam shell red TB, standing hip abd    Consulted and Agree with Plan of Care Patient           Patient will benefit from skilled therapeutic intervention in order to improve the following deficits and impairments:  Decreased activity tolerance, Decreased knowledge of precautions, Decreased mobility, Decreased range of motion, Decreased safety awareness, Decreased strength, Difficulty walking, Increased fascial restricitons, Increased muscle spasms, Impaired perceived functional ability, Impaired flexibility, Improper body mechanics, Postural dysfunction, Pain  Visit Diagnosis: Chronic bilateral low back pain with bilateral sciatica  Muscle spasm of back  Muscle weakness (generalized)     Problem List Patient Active Problem List   Diagnosis Date Noted   Other intervertebral disc degeneration, lumbar region 05/25/2020   Lumbar facet arthropathy 03/30/2019   Headache 09/29/2017   S/P total thyroidectomy 01/16/2017   Bess Harvest, PTA 06/27/20 11:55 AM   [facility] [place] [facility] [address]  [place], [place], [address] Phone: [phone]   Fax:  [phone]  Name: ALFREIDA STEFFENHAGEN MRN: [id number] Date of Birth: 1971-06-28

## 2020-07-02 ENCOUNTER — Other Ambulatory Visit: Payer: Self-pay

## 2020-07-02 ENCOUNTER — Ambulatory Visit: Payer: 59 | Admitting: Physical Therapy

## 2020-07-02 DIAGNOSIS — M6281 Muscle weakness (generalized): Secondary | ICD-10-CM

## 2020-07-02 DIAGNOSIS — M5442 Lumbago with sciatica, left side: Secondary | ICD-10-CM | POA: Diagnosis not present

## 2020-07-02 DIAGNOSIS — G8929 Other chronic pain: Secondary | ICD-10-CM

## 2020-07-02 DIAGNOSIS — M62830 Muscle spasm of back: Secondary | ICD-10-CM

## 2020-07-02 NOTE — Therapy (Signed)
[facility] [address]  [place], [place], [address] Phone: [phone]   Fax:  [phone]  Physical Therapy Treatment  Patient Details  Name: Sydney Stephens MRN: [id number] Date of Birth: [birth year] Referring Provider (PT): Rodell Perna, MD   Encounter Date: 07/02/2020   PT End of Session - 07/02/20 0806    Visit Number 7    Number of Visits 12    Date for PT Re-Evaluation 07/23/20    Authorization Type UHC    PT Start Time 0806    PT Stop Time 0851    PT Time Calculation (min) 45 min    Activity Tolerance Patient tolerated treatment well    Behavior During Therapy [facility] for tasks assessed/performed           Past Medical History:  Diagnosis Date  . GERD (gastroesophageal reflux disease)   . Hypothyroidism   . PONV (postoperative nausea and vomiting)     Past Surgical History:  Procedure Laterality Date  . BREAST LUMPECTOMY    . LAPAROTOMY    . THYROIDECTOMY N/A 01/16/2017   Procedure: THYROIDECTOMY;  Surgeon: Izora Gala, MD;  Location: [place] [facility];  Service: ENT;  Laterality: N/A;  RNFA ([place])  . TONSILLECTOMY    . wisdom teeth      There were no vitals filed for this visit.   Subjective Assessment - 07/02/20 0810    Subjective Pt reports her L side is so much better since the DN but having some pain on R side today. States she did a lot of walking over Thanksgiving.    Diagnostic tests Lumbar MRI 05/22/20: 1. At L4-L5 there is a left foraminal/extraforaminal discprotrusion, which results in moderate left foraminal andsubarticular recess stenosis.2. At L3-L4 there is moderate right subarticular recess stenosis,mild canal stenosis, and mild bilateral foraminal stenosis.3. Right eccentric degenerative disc disease at L3-L4.    Patient Stated Goals "no pain"    Currently in Pain? No/denies    Pain Score 7     Pain Location Back    Pain Orientation Right;Lower;Lateral    Pain Descriptors / Indicators  Dull;Aching;Sharp    Pain Type Acute pain                             [facility] Adult PT Treatment/Exercise - 07/02/20 0806      Exercises   Exercises Lumbar      Lumbar Exercises: Aerobic   Recumbent Bike L5 x 6 min      Manual Therapy   Manual Therapy Soft tissue mobilization;Myofascial release;Taping    Manual therapy comments skilled palpation and monitoring during DN    Soft tissue mobilization STM/DTM to B glute medius/minumus & piriformis    Myofascial Release manual TPR to B glute medius/minumus & R piriformis      [employer]inesiotix   Create Space B lumbar paraspinals - 30% from glutes to lower/mid thoracic paraspinals + 30-50% perpendicular strip at level of PSIS   Rock tape           Trigger Point Dry Needling - 07/02/20 0806    Consent Given? Yes    Muscles Treated Back/Hip Gluteus minimus;Gluteus medius;Piriformis;Erector spinae    Electrical Stimulation Performed with Dry Needling Yes    Gluteus Minimus Response Twitch response elicited;Palpable increased muscle length   Bilateral   Gluteus Medius Response Twitch response elicited;Palpable increased muscle length   Bilateral   Piriformis Response  Twitch response elicited;Palpable increased muscle length   Rt   Erector spinae Response Twitch response elicited;Palpable increased muscle length   Bilateral                 PT Short Term Goals - 06/25/20 1705      PT SHORT TERM GOAL #1   Title Patient will be independent with initial HEP    Status Achieved   06/20/20     PT SHORT TERM GOAL #2   Title Patient will verbalize/demonstrate understanding of neutral spine posture and proper body mechanics to reduce strain on lumbar spine    Status Achieved   06/20/20 - Pt has made adjustment to her sleeping and desk positioning with good understanding of neutral spine posture     PT SHORT TERM GOAL #3   Title Patient to report low back pain reduction in frequency and intensity by >/= 25%    Status  Achieved   06/25/20 - pt reports 30% reduction in pain & intensity with pain now more centralized            PT Long Term Goals - 06/25/20 1707      PT LONG TERM GOAL #1   Title Patient will be independent with ongoing/advanced HEP    Status On-going    Target Date 07/23/20      PT LONG TERM GOAL #2   Title Patient to demonstrate ability to achieve and maintain good spinal alignment/posturing    Status On-going    Target Date 07/23/20      PT LONG TERM GOAL #3   Title Patient to report reduction in frequency and intensity of LBP by >/= 50%    Status On-going    Target Date 07/23/20      PT LONG TERM GOAL #4   Title Patient to improve lumbar AROM to WFL/WNL without pain provocation    Status On-going    Target Date 07/23/20      PT LONG TERM GOAL #5   Title Patient will demonstrate improved B proximal LE strength to >/= 4 to 4+/5 for improved stability and ease of mobility    Status On-going    Target Date 07/23/20      PT LONG TERM GOAL #6   Title Patient to report ability to perform ADLs, household, and work-related tasks without increased pain    Status On-going    Target Date 07/23/20                 Plan - 07/02/20 0813    Clinical Impression Statement Vinnie noting excellent relief from DN to L buttocks last week but reports increased R sided pain since. Addressed increased muscle tension in R glutes and piriformis along with B erector spinae and L glute medius/minimus with MT incorporating DN today with good twitch responses elicited resulting in palpable reduction in muscle tension. Ktape reapplied as pt continues to note benefit from taping.    Comorbidities Arthritis, thyroidectomy, GERD    Rehab Potential Good    PT Frequency 2x / week    PT Duration 6 weeks    PT Treatment/Interventions ADLs/Self Care Home Management;Cryotherapy;Electrical Stimulation;Iontophoresis 4mg /ml Dexamethasone;Moist Heat;Traction;Ultrasound;Functional mobility  training;Therapeutic activities;Therapeutic exercise;Neuromuscular re-education;Patient/family education;Manual techniques;Passive range of motion;Dry needling;Taping;Spinal Manipulations    PT Next Visit Plan Lumbopelvic flexibility and strengthening; manual therapy and modalities PRN    PT Home Exercise Plan 11/8 - HS, ITB, piriformis & LTR stretches, pelvic tilt; bridge/abd red TB, glute self-massage on wall; 11/15 -  quadruped LE raise, clam shell red TB, standing hip abd    Consulted and Agree with Plan of Care Patient           Patient will benefit from skilled therapeutic intervention in order to improve the following deficits and impairments:  Decreased activity tolerance, Decreased knowledge of precautions, Decreased mobility, Decreased range of motion, Decreased safety awareness, Decreased strength, Difficulty walking, Increased fascial restricitons, Increased muscle spasms, Impaired perceived functional ability, Impaired flexibility, Improper body mechanics, Postural dysfunction, Pain  Visit Diagnosis: Chronic bilateral low back pain with bilateral sciatica  Muscle spasm of back  Muscle weakness (generalized)     Problem List Patient Active Problem List   Diagnosis Date Noted  . Other intervertebral disc degeneration, lumbar region 05/25/2020  . Lumbar facet arthropathy 03/30/2019  . Headache 09/29/2017  . S/P total thyroidectomy 01/16/2017    Percival Spanish, PT, MPT 07/02/2020, 9:22 AM  [facility] [address]  [place], [place], [address] Phone: [phone]   Fax:  [phone]  Name: Sydney Stephens MRN: [id number] Date of Birth: 02/17/1971

## 2020-07-05 ENCOUNTER — Encounter: Payer: Self-pay | Admitting: Physical Therapy

## 2020-07-05 ENCOUNTER — Ambulatory Visit: Payer: 59 | Attending: Orthopaedic Surgery | Admitting: Physical Therapy

## 2020-07-05 ENCOUNTER — Other Ambulatory Visit: Payer: Self-pay

## 2020-07-05 DIAGNOSIS — M5441 Lumbago with sciatica, right side: Secondary | ICD-10-CM | POA: Diagnosis present

## 2020-07-05 DIAGNOSIS — M5442 Lumbago with sciatica, left side: Secondary | ICD-10-CM | POA: Insufficient documentation

## 2020-07-05 DIAGNOSIS — G8929 Other chronic pain: Secondary | ICD-10-CM | POA: Diagnosis present

## 2020-07-05 DIAGNOSIS — M62830 Muscle spasm of back: Secondary | ICD-10-CM | POA: Diagnosis present

## 2020-07-05 DIAGNOSIS — M6281 Muscle weakness (generalized): Secondary | ICD-10-CM | POA: Insufficient documentation

## 2020-07-05 NOTE — Therapy (Signed)
[facility] [place] [facility] [address]  [place], [place], [address] Phone: [phone]   Fax:  [phone]  Physical Therapy Treatment  Patient Details  Name: Sydney Stephens MRN: [id number] Date of Birth: [birth year] Referring Provider (PT): Rodell Perna, MD   Encounter Date: 07/05/2020   PT End of Session - 07/05/20 0808    Visit Number 8    Number of Visits 12    Date for PT Re-Evaluation 07/23/20    Authorization Type UHC    PT Start Time 0808   Pt arrived late   PT Stop Time 0846    PT Time Calculation (min) 38 min    Activity Tolerance Patient tolerated treatment well    Behavior During Therapy [facility] for tasks assessed/performed           Past Medical History:  Diagnosis Date  . GERD (gastroesophageal reflux disease)   . Hypothyroidism   . PONV (postoperative nausea and vomiting)     Past Surgical History:  Procedure Laterality Date  . BREAST LUMPECTOMY    . LAPAROTOMY    . THYROIDECTOMY N/A 01/16/2017   Procedure: THYROIDECTOMY;  Surgeon: Izora Gala, MD;  Location: [facility];  Service: ENT;  Laterality: N/A;  RNFA ([place])  . TONSILLECTOMY    . wisdom teeth      There were no vitals filed for this visit.   Subjective Assessment - 07/05/20 0811    Subjective Pt reports she is now to the point where she only has pain if she sits for too long.    Diagnostic tests Lumbar MRI 05/22/20: 1. At L4-L5 there is a left foraminal/extraforaminal discprotrusion, which results in moderate left foraminal andsubarticular recess stenosis.2. At L3-L4 there is moderate right subarticular recess stenosis,mild canal stenosis, and mild bilateral foraminal stenosis.3. Right eccentric degenerative disc disease at L3-L4.    Patient Stated Goals "no pain"    Currently in Pain? No/denies                             [facility] Adult PT Treatment/Exercise - 07/05/20 0808      Exercises   Exercises Lumbar      Lumbar Exercises:  Aerobic   Recumbent Bike L6 x 6 min      Lumbar Exercises: Standing   Functional Squats 10 reps;3 seconds    Functional Squats Limitations + red TB hip ABD isometric    Scapular Retraction Both;10 reps;Strengthening;Theraband    Theraband Level (Scapular Retraction) Level 3 (Green)    Scapular Retraction Limitations low row - cues for abdominal bracing & scap retraction    Row Both;10 reps;Strengthening;Theraband    Theraband Level (Row) Level 3 (Green)    Row Limitations cues for abdominal bracing & scap retraction    Other Standing Lumbar Exercises B side stepping and fwd/back monster walk with green TB 2 x 25 ft    Other Standing Lumbar Exercises B green TB pallof press 10 x 5"      Lumbar Exercises: Supine   Bridge with clamshell 10 reps;5 seconds    Bridge with [employer] Limitations green TB alt hip ABD/ER      Lumbar Exercises: Sidelying   Clam Right;Left;15 reps;3 seconds    Clam Limitations green TB      Lumbar Exercises: Quadruped   Opposite Arm/Leg Raise 15 reps;5 seconds;Right arm/Left leg;Left arm/Right leg    Opposite Arm/Leg Raise Limitations cue to increase  hold times    Other Quadruped Lumbar Exercises R/L green TB fire hydrants 10 x 3"                  PT Education - 07/05/20 0845    Education Details HEP update - strengthening progression    Person(s) Educated Patient    Methods Explanation;Demonstration;Verbal cues;Handout    Comprehension Verbalized understanding;Verbal cues required;Returned demonstration;Need further instruction            PT Short Term Goals - 06/25/20 1705      PT SHORT TERM GOAL #1   Title Patient will be independent with initial HEP    Status Achieved   06/20/20     PT SHORT TERM GOAL #2   Title Patient will verbalize/demonstrate understanding of neutral spine posture and proper body mechanics to reduce strain on lumbar spine    Status Achieved   06/20/20 - Pt has made adjustment to her sleeping and desk positioning  with good understanding of neutral spine posture     PT SHORT TERM GOAL #3   Title Patient to report low back pain reduction in frequency and intensity by >/= 25%    Status Achieved   06/25/20 - pt reports 30% reduction in pain & intensity with pain now more centralized            PT Long Term Goals - 06/25/20 1707      PT LONG TERM GOAL #1   Title Patient will be independent with ongoing/advanced HEP    Status On-going    Target Date 07/23/20      PT LONG TERM GOAL #2   Title Patient to demonstrate ability to achieve and maintain good spinal alignment/posturing    Status On-going    Target Date 07/23/20      PT LONG TERM GOAL #3   Title Patient to report reduction in frequency and intensity of LBP by >/= 50%    Status On-going    Target Date 07/23/20      PT LONG TERM GOAL #4   Title Patient to improve lumbar AROM to WFL/WNL without pain provocation    Status On-going    Target Date 07/23/20      PT LONG TERM GOAL #5   Title Patient will demonstrate improved B proximal LE strength to >/= 4 to 4+/5 for improved stability and ease of mobility    Status On-going    Target Date 07/23/20      PT LONG TERM GOAL #6   Title Patient to report ability to perform ADLs, household, and work-related tasks without increased pain    Status On-going    Target Date 07/23/20                 Plan - 07/05/20 8527    Clinical Impression Statement Sydney Stephens reports good relief from DN last visit and states LBP becoming less frequent, typically only after sitting for too long. Reviewed current strengthening HEP, providing repeat instruction for supported squats as pt admits she had forgotten to add these into HEP, instruction in progression of other HEP exercises with upgrade resistance and/or complexity of exercises, and adding a few new exercises to further promote core stability and proximal LE strength for better lumbar support. Pt able to provide good return demonstration of all  exercises. Pt encouraged to continue with stretches daily and perform strengthening exercises at least 3x/wk.    Comorbidities Arthritis, thyroidectomy, GERD    Rehab Potential Good  PT Frequency 2x / week    PT Duration 6 weeks    PT Treatment/Interventions ADLs/Self Care Home Management;Cryotherapy;Electrical Stimulation;Iontophoresis 4mg /ml Dexamethasone;Moist Heat;Traction;Ultrasound;Functional mobility training;Therapeutic activities;Therapeutic exercise;Neuromuscular re-education;Patient/family education;Manual techniques;Passive range of motion;Dry needling;Taping;Spinal Manipulations    PT Next Visit Plan Lumbopelvic flexibility and strengthening; manual therapy and modalities PRN    PT Home Exercise Plan 11/8 - HS, ITB, piriformis & LTR stretches, pelvic tilt; bridge/abd red TB, glute self-massage on wall; 11/15 - quadruped LE raise, clam shell red TB, standing hip abd; 11/24 - counter squat; 12/2 - bird dog, green TB fire hydrants, red TB side-step/monster walk, green TB rows/retraction/pallof press    Consulted and Agree with Plan of Care Patient           Patient will benefit from skilled therapeutic intervention in order to improve the following deficits and impairments:  Decreased activity tolerance, Decreased knowledge of precautions, Decreased mobility, Decreased range of motion, Decreased safety awareness, Decreased strength, Difficulty walking, Increased fascial restricitons, Increased muscle spasms, Impaired perceived functional ability, Impaired flexibility, Improper body mechanics, Postural dysfunction, Pain  Visit Diagnosis: Chronic bilateral low back pain with bilateral sciatica  Muscle spasm of back  Muscle weakness (generalized)     Problem List Patient Active Problem List   Diagnosis Date Noted  . Other intervertebral disc degeneration, lumbar region 05/25/2020  . Lumbar facet arthropathy 03/30/2019  . Headache 09/29/2017  . S/P total thyroidectomy  01/16/2017    Percival Spanish, PT, MPT 07/05/2020, 5:22 PM  [facility] [address]  [place], [place], [address] Phone: [phone]   Fax:  [phone]  Name: Sydney Stephens MRN: [id number] Date of Birth: 02-19-1971

## 2020-07-05 NOTE — Patient Instructions (Signed)
    Home exercise program created by Serafino Burciaga, PT.  For questions, please contact Estefanie Cornforth via phone at [phone] or email at [email or web address]  [facility] [place] [address]  [address] [place], [place], [address] Phone: [phone]   Fax:  [phone]    

## 2020-07-09 ENCOUNTER — Other Ambulatory Visit: Payer: Self-pay

## 2020-07-09 ENCOUNTER — Ambulatory Visit: Payer: 59

## 2020-07-09 DIAGNOSIS — M5442 Lumbago with sciatica, left side: Secondary | ICD-10-CM | POA: Diagnosis not present

## 2020-07-09 DIAGNOSIS — G8929 Other chronic pain: Secondary | ICD-10-CM

## 2020-07-09 DIAGNOSIS — M6281 Muscle weakness (generalized): Secondary | ICD-10-CM

## 2020-07-09 DIAGNOSIS — M62830 Muscle spasm of back: Secondary | ICD-10-CM

## 2020-07-09 NOTE — Therapy (Signed)
[facility] [address]  [place], [place], [address] Phone: [phone]   Fax:  [phone]  Physical Therapy Treatment  Patient Details  Name: Sydney Stephens MRN: [id number] Date of Birth: 12-Jul-1971 Referring Provider (PT): Rodell Perna, MD   Encounter Date: 07/09/2020   PT End of Session - 07/09/20 0808    Visit Number 9    Number of Visits 12    Date for PT Re-Evaluation 07/23/20    Authorization Type UHC    PT Start Time 0801    PT Stop Time 0839    PT Time Calculation (min) 38 min    Activity Tolerance Patient tolerated treatment well    Behavior During Therapy [facility] for tasks assessed/performed           Past Medical History:  Diagnosis Date  . GERD (gastroesophageal reflux disease)   . Hypothyroidism   . PONV (postoperative nausea and vomiting)     Past Surgical History:  Procedure Laterality Date  . BREAST LUMPECTOMY    . LAPAROTOMY    . THYROIDECTOMY N/A 01/16/2017   Procedure: THYROIDECTOMY;  Surgeon: Izora Gala, MD;  Location: [place];  Service: ENT;  Laterality: N/A;  RNFA ([place])  . TONSILLECTOMY    . wisdom teeth      There were no vitals filed for this visit.   Subjective Assessment - 07/09/20 0804    Subjective Pt. reporting most benefit from DN thus far with therapy.    How long can you sit comfortably? 3 hours    How long can you stand comfortably? 3 hours    Diagnostic tests Lumbar MRI 05/22/20: 1. At L4-L5 there is a left foraminal/extraforaminal discprotrusion, which results in moderate left foraminal andsubarticular recess stenosis.2. At L3-L4 there is moderate right subarticular recess stenosis,mild canal stenosis, and mild bilateral foraminal stenosis.3. Right eccentric degenerative disc disease at L3-L4.    Patient Stated Goals "no pain"    Currently in Pain? No/denies    Pain Score 0-No pain   up to 3-4/10 at most   Pain Location Back    Pain Orientation Right;Left;Lateral    R>L   Pain Descriptors / Indicators Aching;Constant    Pain Type Acute pain    Pain Onset More than a month ago    Pain Frequency Constant    Aggravating Factors  Prolonged sitting or standing    Pain Relieving Factors heating pad, muscle relaxers    Effect of Pain on Daily Activities not feeling limited with daily tasks now however does still need to change positions with work frequently    Multiple Pain Sites No                             [facility] Adult PT Treatment/Exercise - 07/09/20 0001      Lumbar Exercises: Stretches   Passive Hamstring Stretch Right;2 reps;30 seconds    Passive Hamstring Stretch Limitations supine with strap    as pt. noting massage therapist on saturday noted tightness    Lumbar Stabilization Level 1 2 reps;20 seconds    Lumbar Stabilization Level 1 Limitations R childs pose 2 x 30se c    Piriformis Stretch Right;1 rep;30 seconds    Piriformis Stretch Limitations R seated     Figure 4 Stretch 1 rep;30 seconds    Figure 4 Stretch Limitations R only     Other Lumbar Stretch Exercise R HS stretch  with hip hinge 2 x 30 sec       Lumbar Exercises: Aerobic   Recumbent Bike L6 x 6 min      Lumbar Exercises: Standing   Scapular Retraction Both;10 reps;Strengthening;Theraband    Theraband Level (Scapular Retraction) Level 3 (Green)    Row Both;15 reps;Strengthening;Theraband    Theraband Level (Row) Level 3 (Green)    Row Limitations cues for increased elbow flexion     Other Standing Lumbar Exercises B side stepping and fwd/back monster walk with green TB 2 x 25 ft   cues for proper trunk positioning avoiding lateral lean   Other Standing Lumbar Exercises B green TB pallof press 15 x 5"   cues for proper trunk position      Lumbar Exercises: Quadruped   Opposite Arm/Leg Raise 15 reps;5 seconds;Right arm/Left leg;Left arm/Right leg    Opposite Arm/Leg Raise Limitations cue to increase hold times    Other Quadruped Lumbar Exercises R/L green TB  fire hydrants 10 x 3"      [employer]   Create Space B lumbar paraspinals - 30% from glutes to lower/mid thoracic paraspinals + 30-50% perpendicular strip at level of PSIS   Rock Tape                    PT Short Term Goals - 06/25/20 1705      PT SHORT TERM GOAL #1   Title Patient will be independent with initial HEP    Status Achieved   06/20/20     PT SHORT TERM GOAL #2   Title Patient will verbalize/demonstrate understanding of neutral spine posture and proper body mechanics to reduce strain on lumbar spine    Status Achieved   06/20/20 - Pt has made adjustment to her sleeping and desk positioning with good understanding of neutral spine posture     PT SHORT TERM GOAL #3   Title Patient to report low back pain reduction in frequency and intensity by >/= 25%    Status Achieved   06/25/20 - pt reports 30% reduction in pain & intensity with pain now more centralized            PT Long Term Goals - 06/25/20 1707      PT LONG TERM GOAL #1   Title Patient will be independent with ongoing/advanced HEP    Status On-going    Target Date 07/23/20      PT LONG TERM GOAL #2   Title Patient to demonstrate ability to achieve and maintain good spinal alignment/posturing    Status On-going    Target Date 07/23/20      PT LONG TERM GOAL #3   Title Patient to report reduction in frequency and intensity of LBP by >/= 50%    Status On-going    Target Date 07/23/20      PT LONG TERM GOAL #4   Title Patient to improve lumbar AROM to WFL/WNL without pain provocation    Status On-going    Target Date 07/23/20      PT LONG TERM GOAL #5   Title Patient will demonstrate improved B proximal LE strength to >/= 4 to 4+/5 for improved stability and ease of mobility    Status On-going    Target Date 07/23/20      PT LONG TERM GOAL #6   Title Patient to report ability to perform ADLs, household, and work-related tasks without increased pain    Status On-going    Target  Date  07/23/20                 Plan - 07/09/20 0808    Clinical Impression Statement Sydney Stephens with no new complaints.  Notes excellent relief from DN over past few visits and notes no issues with HEP.  Able to tolerate progression of repetitions with scapular/lumbopelvic strengthening activities without pain today.  Did require cueing for neutral trunk positioning on review of a few of her HEP activities today.   Pain free during session however notes pain over weekend intermittently rising to 3-4/10 during prolonged sitting/standing in lateral glutes.  Ended session with Rock Tape to lumbar paraspinals/glutes per pt. request.  Notes she is no longer limited by pain with daily tasks at home however still feeling the need to adjust desk positioning for work due to pain.  LTG #6 partially achieved.    Comorbidities Arthritis, thyroidectomy, GERD    Rehab Potential Good    PT Frequency 2x / week    PT Duration 6 weeks    PT Treatment/Interventions ADLs/Self Care Home Management;Cryotherapy;Electrical Stimulation;Iontophoresis 4mg /ml Dexamethasone;Moist Heat;Traction;Ultrasound;Functional mobility training;Therapeutic activities;Therapeutic exercise;Neuromuscular re-education;Patient/family education;Manual techniques;Passive range of motion;Dry needling;Taping;Spinal Manipulations    PT Next Visit Plan Lumbopelvic flexibility and strengthening; manual therapy and modalities PRN    PT Home Exercise Plan 11/8 - HS, ITB, piriformis & LTR stretches, pelvic tilt; bridge/abd red TB, glute self-massage on wall; 11/15 - quadruped LE raise, clam shell red TB, standing hip abd; 11/24 - counter squat; 12/2 - bird dog, green TB fire hydrants, red TB side-step/monster walk, green TB rows/retraction/pallof press    Consulted and Agree with Plan of Care Patient           Patient will benefit from skilled therapeutic intervention in order to improve the following deficits and impairments:  Decreased activity  tolerance, Decreased knowledge of precautions, Decreased mobility, Decreased range of motion, Decreased safety awareness, Decreased strength, Difficulty walking, Increased fascial restricitons, Increased muscle spasms, Impaired perceived functional ability, Impaired flexibility, Improper body mechanics, Postural dysfunction, Pain  Visit Diagnosis: Chronic bilateral low back pain with bilateral sciatica  Muscle spasm of back  Muscle weakness (generalized)     Problem List Patient Active Problem List   Diagnosis Date Noted  . Other intervertebral disc degeneration, lumbar region 05/25/2020  . Lumbar facet arthropathy 03/30/2019  . Headache 09/29/2017  . S/P total thyroidectomy 01/16/2017    Bess Harvest, PTA 07/09/20 1:05 PM   [facility] [address]  [place], [place], [address] Phone: [phone]   Fax:  [phone]  Name: Sydney Stephens MRN: [id number] Date of Birth: 11-25-1970

## 2020-07-12 ENCOUNTER — Ambulatory Visit: Payer: 59 | Admitting: Physical Therapy

## 2020-07-12 ENCOUNTER — Other Ambulatory Visit: Payer: Self-pay

## 2020-07-12 ENCOUNTER — Encounter: Payer: Self-pay | Admitting: Physical Therapy

## 2020-07-12 DIAGNOSIS — M62830 Muscle spasm of back: Secondary | ICD-10-CM

## 2020-07-12 DIAGNOSIS — M6281 Muscle weakness (generalized): Secondary | ICD-10-CM

## 2020-07-12 DIAGNOSIS — M5442 Lumbago with sciatica, left side: Secondary | ICD-10-CM | POA: Diagnosis not present

## 2020-07-12 DIAGNOSIS — G8929 Other chronic pain: Secondary | ICD-10-CM

## 2020-07-12 NOTE — Therapy (Signed)
[facility] [address]  [place], [place], [address] Phone: [phone]   Fax:  [phone]  Physical Therapy Treatment  Patient Details  Name: Sydney Stephens MRN: [id number] Date of Birth: [birth year] Referring Provider (PT): Rodell Perna, MD   Encounter Date: 07/12/2020   PT End of Session - 07/12/20 0802    Visit Number 10    Number of Visits 12    Date for PT Re-Evaluation 07/23/20    Authorization Type UHC    PT Start Time 0802    PT Stop Time 0844    PT Time Calculation (min) 42 min    Activity Tolerance Patient tolerated treatment well    Behavior During Therapy [facility] for tasks assessed/performed           Past Medical History:  Diagnosis Date  . GERD (gastroesophageal reflux disease)   . Hypothyroidism   . PONV (postoperative nausea and vomiting)     Past Surgical History:  Procedure Laterality Date  . BREAST LUMPECTOMY    . LAPAROTOMY    . THYROIDECTOMY N/A 01/16/2017   Procedure: THYROIDECTOMY;  Surgeon: Izora Gala, MD;  Location: [place];  Service: ENT;  Laterality: N/A;  RNFA ([place])  . TONSILLECTOMY    . wisdom teeth      There were no vitals filed for this visit.   Subjective Assessment - 07/12/20 0807    Subjective Pt reports her back is more aggravated today after walking a lot of stairs yesterday hauling new recliners up the stairs.    Diagnostic tests Lumbar MRI 05/22/20: 1. At L4-L5 there is a left foraminal/extraforaminal discprotrusion, which results in moderate left foraminal andsubarticular recess stenosis.2. At L3-L4 there is moderate right subarticular recess stenosis,mild canal stenosis, and mild bilateral foraminal stenosis.3. Right eccentric degenerative disc disease at L3-L4.    Patient Stated Goals "no pain"    Currently in Pain? No/denies    Pain Score 0-No pain   up to 5/10   Pain Location Back    Pain Orientation Lower;Mid    Pain Descriptors / Indicators Aching    Pain  Type Acute pain    Pain Frequency Intermittent                             [facility] Adult PT Treatment/Exercise - 07/12/20 0802      Exercises   Exercises Lumbar      Lumbar Exercises: Stretches   Lumbar Stabilization Level 1 30 seconds;3 reps    Lumbar Stabilization Level 1 Limitations 3-way child's pose/prayer stretch    Standing Side Bend Right;Left;30 seconds;2 reps    Standing Side Bend Limitations QL stretch      Lumbar Exercises: Aerobic   Recumbent Bike L8 x 6 min      Manual Therapy   Manual Therapy Soft tissue mobilization;Myofascial release    Manual therapy comments skilled palpation and monitoring during DN    Soft tissue mobilization STM/DTM to B lumbar paraspinals, glute medius/minumus & piriformis    Myofascial Release manual TPR to B glute medius; pin & stretch to B lumbar paraspinals      [employer]   Create Space deferred due to mild skin irritation            Trigger Point Dry Needling - 07/12/20 0802    Consent Given? Yes    Muscles Treated Back/Hip Gluteus medius;Erector spinae;Lumbar multifidi   Bilateral  Electrical Stimulation Performed with Dry Needling Yes    E-stim with Dry Needling Details lumbar multifidi    Gluteus Medius Response Twitch response elicited;Palpable increased muscle length   Bilateral medial   Erector spinae Response Twitch response elicited;Palpable increased muscle length    Lumbar multifidi Response Twitch response elicited;Palpable increased muscle length                  PT Short Term Goals - 06/25/20 1705      PT SHORT TERM GOAL #1   Title Patient will be independent with initial HEP    Status Achieved   06/20/20     PT SHORT TERM GOAL #2   Title Patient will verbalize/demonstrate understanding of neutral spine posture and proper body mechanics to reduce strain on lumbar spine    Status Achieved   06/20/20 - Pt has made adjustment to her sleeping and desk positioning with good understanding  of neutral spine posture     PT SHORT TERM GOAL #3   Title Patient to report low back pain reduction in frequency and intensity by >/= 25%    Status Achieved   06/25/20 - pt reports 30% reduction in pain & intensity with pain now more centralized            PT Long Term Goals - 06/25/20 1707      PT LONG TERM GOAL #1   Title Patient will be independent with ongoing/advanced HEP    Status On-going    Target Date 07/23/20      PT LONG TERM GOAL #2   Title Patient to demonstrate ability to achieve and maintain good spinal alignment/posturing    Status On-going    Target Date 07/23/20      PT LONG TERM GOAL #3   Title Patient to report reduction in frequency and intensity of LBP by >/= 50%    Status On-going    Target Date 07/23/20      PT LONG TERM GOAL #4   Title Patient to improve lumbar AROM to WFL/WNL without pain provocation    Status On-going    Target Date 07/23/20      PT LONG TERM GOAL #5   Title Patient will demonstrate improved B proximal LE strength to >/= 4 to 4+/5 for improved stability and ease of mobility    Status On-going    Target Date 07/23/20      PT LONG TERM GOAL #6   Title Patient to report ability to perform ADLs, household, and work-related tasks without increased pain    Status On-going    Target Date 07/23/20                 Plan - 07/12/20 0810    Clinical Impression Statement Sydney Stephens reports increased mid-low back pain/irritation after helping her son move 2 new recliners they had purchased into the house and upstairs. Increased muscle tension noted in lower thoracic and lumbar paraspinals as well as medial upper glutes - addressed with manual therapy incorporating DN as well as stretching with some relief noted but R side remaining somewhat tighter than L. Pt feels that she has improved with PT thus far but feels that she will not be ready to transition to the HEP as of the anticipated end of her POC next week, therefore will consider  recert upon goal assessment next week.    Comorbidities Arthritis, thyroidectomy, GERD    Rehab Potential Good    PT Frequency 2x /  week    PT Duration 6 weeks    PT Treatment/Interventions ADLs/Self Care Home Management;Cryotherapy;Electrical Stimulation;Iontophoresis 4mg /ml Dexamethasone;Moist Heat;Traction;Ultrasound;Functional mobility training;Therapeutic activities;Therapeutic exercise;Neuromuscular re-education;Patient/family education;Manual techniques;Passive range of motion;Dry needling;Taping;Spinal Manipulations    PT Next Visit Plan Lumbopelvic flexibility and strengthening; manual therapy and modalities PRN    PT Home Exercise Plan 11/8 - HS, ITB, piriformis & LTR stretches, pelvic tilt; bridge/abd red TB, glute self-massage on wall; 11/15 - quadruped LE raise, clam shell red TB, standing hip abd; 11/24 - counter squat; 12/2 - bird dog, green TB fire hydrants, red TB side-step/monster walk, green TB rows/retraction/pallof press    Consulted and Agree with Plan of Care Patient           Patient will benefit from skilled therapeutic intervention in order to improve the following deficits and impairments:  Decreased activity tolerance,Decreased knowledge of precautions,Decreased mobility,Decreased range of motion,Decreased safety awareness,Decreased strength,Difficulty walking,Increased fascial restricitons,Increased muscle spasms,Impaired perceived functional ability,Impaired flexibility,Improper body mechanics,Postural dysfunction,Pain  Visit Diagnosis: Chronic bilateral low back pain with bilateral sciatica  Muscle spasm of back  Muscle weakness (generalized)     Problem List Patient Active Problem List   Diagnosis Date Noted  . Other intervertebral disc degeneration, lumbar region 05/25/2020  . Lumbar facet arthropathy 03/30/2019  . Headache 09/29/2017  . S/P total thyroidectomy 01/16/2017    Percival Spanish, PT, MPT 07/12/2020, 12:31 PM  [facility] [address]  [place], [place], [address] Phone: [phone]   Fax:  [phone]  Name: Sydney Stephens MRN: [id number] Date of Birth: Dec 12, 1970

## 2020-07-16 ENCOUNTER — Ambulatory Visit: Payer: 59

## 2020-07-19 ENCOUNTER — Encounter: Payer: Self-pay | Admitting: Physical Therapy

## 2020-07-19 ENCOUNTER — Other Ambulatory Visit: Payer: Self-pay

## 2020-07-19 ENCOUNTER — Ambulatory Visit: Payer: 59 | Admitting: Physical Therapy

## 2020-07-19 DIAGNOSIS — M5442 Lumbago with sciatica, left side: Secondary | ICD-10-CM

## 2020-07-19 DIAGNOSIS — M6281 Muscle weakness (generalized): Secondary | ICD-10-CM

## 2020-07-19 DIAGNOSIS — M62830 Muscle spasm of back: Secondary | ICD-10-CM

## 2020-07-19 NOTE — Therapy (Signed)
[facility] [address]  [place], [place], [address] Phone: [phone]   Fax:  [phone]  Physical Therapy Treatment  Patient Details  Name: Sydney Stephens MRN: [id number] Date of Birth: 04-Jan-1971 Referring Provider (PT): Rodell Perna, MD   Encounter Date: 07/19/2020   PT End of Session - 07/19/20 0805    Visit Number 11    Number of Visits 12    Date for PT Re-Evaluation 07/23/20    Authorization Type UHC    PT Start Time 0802    PT Stop Time 0848    PT Time Calculation (min) 46 min    Activity Tolerance Patient tolerated treatment well    Behavior During Therapy [facility] for tasks assessed/performed           Past Medical History:  Diagnosis Date  . GERD (gastroesophageal reflux disease)   . Hypothyroidism   . PONV (postoperative nausea and vomiting)     Past Surgical History:  Procedure Laterality Date  . BREAST LUMPECTOMY    . LAPAROTOMY    . THYROIDECTOMY N/A 01/16/2017   Procedure: THYROIDECTOMY;  Surgeon: Izora Gala, MD;  Location: [place];  Service: ENT;  Laterality: N/A;  RNFA ([place])  . TONSILLECTOMY    . wisdom teeth      There were no vitals filed for this visit.   Subjective Assessment - 07/19/20 0806    Subjective Pt reports her back only typically bothers her if she has to sit or stand for long periods. 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    How long can you sit comfortably? >1.5 hrs    How long can you stand comfortably? >1.5 hrs    Diagnostic tests Lumbar MRI 05/22/20: 1. At L4-L5 there is a left foraminal/extraforaminal discprotrusion, which results in moderate left foraminal andsubarticular recess stenosis.2. At L3-L4 there is moderate right subarticular recess stenosis,mild canal stenosis, and mild bilateral foraminal stenosis.3. Right eccentric degenerative disc disease at L3-L4.    Patient Stated Goals "no pain"    Currently in Pain? No/denies               [facility] PT Assessment - 07/19/20 0802      Assessment   Medical Diagnosis Chronic B LBP with B sciatica    Referring Provider (PT) Rodell Perna, MD    Onset Date/Surgical Date 05/03/20    Next MD Visit none scheduled      Observation/Other Assessments   Focus on Therapeutic Outcomes (FOTO)  Lumbar - 65% (35% limitation)      AROM   Lumbar Flexion hands to ankles    Lumbar Extension WNL - no pain    Lumbar - Right Side Bend hand to fibular head    Lumbar - Left Side Bend hand to knee joint line - dull pain on L, stretching on R    Lumbar - Right Rotation WNL    Lumbar - Left Rotation WNL      Strength   Right Hip Flexion 4+/5    Right Hip Extension 4+/5    Right Hip External Rotation  4/5    Right Hip Internal Rotation 4+/5    Right Hip ABduction 4+/5    Right Hip ADduction 4+/5    Left Hip Flexion 4+/5    Left Hip Extension 4+/5    Left Hip External Rotation 4+/5    Left Hip Internal Rotation 5/5    Left Hip ABduction  4+/5    Left Hip ADduction 4+/5    Right Knee Flexion 5/5    Right Knee Extension 5/5    Left Knee Flexion 5/5    Left Knee Extension 5/5    Right Ankle Dorsiflexion 5/5    Left Ankle Dorsiflexion 5/5                         [facility] Adult PT Treatment/Exercise - 07/19/20 0802      Exercises   Exercises Lumbar      Lumbar Exercises: Aerobic   Recumbent Bike L8 x 6 min      Manual Therapy   Manual Therapy Soft tissue mobilization;Myofascial release    Manual therapy comments skilled palpation and monitoring during DN    Soft tissue mobilization STM/DTM to B lumbar paraspinals, glute medius/minumus & piriformis    Myofascial Release manual TPR to B glute medius & piriformis; pin & stretch to B lumbar paraspinals            Trigger Point Dry Needling - 07/19/20 0802    Consent Given? Yes    Muscles Treated Back/Hip Lumbar multifidi;Gluteus medius;Piriformis   Bilateral   Gluteus Medius Response Twitch response elicited;Palpable  increased muscle length    Piriformis Response Twitch response elicited;Palpable increased muscle length    Lumbar multifidi Response Twitch response elicited;Palpable increased muscle length                  PT Short Term Goals - 06/25/20 1705      PT SHORT TERM GOAL #1   Title Patient will be independent with initial HEP    Status Achieved   06/20/20     PT SHORT TERM GOAL #2   Title Patient will verbalize/demonstrate understanding of neutral spine posture and proper body mechanics to reduce strain on lumbar spine    Status Achieved   06/20/20 - Pt has made adjustment to her sleeping and desk positioning with good understanding of neutral spine posture     PT SHORT TERM GOAL #3   Title Patient to report low back pain reduction in frequency and intensity by >/= 25%    Status Achieved   06/25/20 - pt reports 30% reduction in pain & intensity with pain now more centralized            PT Long Term Goals - 07/19/20 0809      PT LONG TERM GOAL #1   Title Patient will be independent with ongoing/advanced HEP    Status Achieved      PT LONG TERM GOAL #2   Title Patient to demonstrate ability to achieve and maintain good spinal alignment/posturing    Status Achieved   07/19/20     PT LONG TERM GOAL #3   Title Patient to report reduction in frequency and intensity of LBP by >/= 50%    Status Achieved   07/19/20 - Pt reports 80-85% reduction in pain     PT LONG TERM GOAL #4   Title Patient to improve lumbar AROM to WFL/WNL without pain provocation    Status Partially Met   07/19/20 - met except pain with L side bend     PT LONG TERM GOAL #5   Title Patient will demonstrate improved B proximal LE strength to >/= 4 to 4+/5 for improved stability and ease of mobility    Status Achieved   07/19/20     PT LONG TERM GOAL #6  Title Patient to report ability to perform ADLs, household, and work-related tasks without increased pain    Status Partially Met   07/19/20 - still  notes increased pain after prolonged sitting or standing at work or when vacuuming for extended periods at home, but otherwise no pain                Plan - 07/19/20 0848    Clinical Impression Statement Sydney Stephens reports 80-85% reduction in her pain since start of PT and has exceeded the predicted FOTO value with only 35% limitation noted. She reports pain now typically only with prolonged sitting for standing (>1.5 hrs) or vacuuming but otherwise is able to complete majority of daily tasks w/o pain interference. Lumbar ROM now WFL/WNL with only mild pain/discomfort noted with L side bending. Proximal LE strength significantly improved with overall LE strength now grossly >/= 4+/5. All goals met with exceptions of ROM and functional tolerance goals only partially met with limitations as described above. Given progress, pt should be able to continue to address remaining deficits with HEP, therefore will plan for final HEP review/update and transition to HEP with 30-day hold on final visit next week.    Comorbidities Arthritis, thyroidectomy, GERD    Rehab Potential Good    PT Frequency 2x / week    PT Duration 6 weeks    PT Treatment/Interventions ADLs/Self Care Home Management;Cryotherapy;Electrical Stimulation;Iontophoresis 37m/ml Dexamethasone;Moist Heat;Traction;Ultrasound;Functional mobility training;Therapeutic activities;Therapeutic exercise;Neuromuscular re-education;Patient/family education;Manual techniques;Passive range of motion;Dry needling;Taping;Spinal Manipulations    PT Next Visit Plan Final HEP review/update; transition to HEP + 30-day hold    PT Home Exercise Plan 11/8 - HS, ITB, piriformis & LTR stretches, pelvic tilt; bridge/abd red TB, glute self-massage on wall; 11/15 - quadruped LE raise, clam shell red TB, standing hip abd; 11/24 - counter squat; 12/2 - bird dog, green TB fire hydrants, red TB side-step/monster walk, green TB rows/retraction/pallof press    Consulted and  Agree with Plan of Care Patient           Patient will benefit from skilled therapeutic intervention in order to improve the following deficits and impairments:  Decreased activity tolerance,Decreased knowledge of precautions,Decreased mobility,Decreased range of motion,Decreased safety awareness,Decreased strength,Difficulty walking,Increased fascial restricitons,Increased muscle spasms,Impaired perceived functional ability,Impaired flexibility,Improper body mechanics,Postural dysfunction,Pain  Visit Diagnosis: Chronic bilateral low back pain with bilateral sciatica  Muscle spasm of back  Muscle weakness (generalized)     Problem List Patient Active Problem List   Diagnosis Date Noted  . Other intervertebral disc degeneration, lumbar region 05/25/2020  . Lumbar facet arthropathy 03/30/2019  . Headache 09/29/2017  . S/P total thyroidectomy 01/16/2017    JPercival Spanish PT, MPT 07/19/2020, 4:01 PM  [facility] [place] [address]Phone: [phone]  Fax:  [phone] Name: RLULAMAE SKORUPSKIMRN: [id number] of Birth: 110-02-1971

## 2020-07-23 ENCOUNTER — Other Ambulatory Visit: Payer: Self-pay

## 2020-07-23 ENCOUNTER — Ambulatory Visit: Payer: 59

## 2020-07-23 DIAGNOSIS — M5441 Lumbago with sciatica, right side: Secondary | ICD-10-CM

## 2020-07-23 DIAGNOSIS — M6281 Muscle weakness (generalized): Secondary | ICD-10-CM

## 2020-07-23 DIAGNOSIS — M5442 Lumbago with sciatica, left side: Secondary | ICD-10-CM | POA: Diagnosis not present

## 2020-07-23 DIAGNOSIS — M62830 Muscle spasm of back: Secondary | ICD-10-CM

## 2020-07-23 NOTE — Therapy (Addendum)
[facility] [address]  [place], [place], [address] Phone: [phone]   Fax:  [phone]  Physical Therapy Treatment / Discharge Summary  Patient Details  Name: Sydney Stephens MRN: [id number] Date of Birth: March 02, 1971 Referring Provider (PT): Rodell Perna, MD   Encounter Date: 07/23/2020   PT End of Session - 07/23/20 0811    Visit Number 12    Number of Visits 12    Date for PT Re-Evaluation 07/23/20    Authorization Type UHC    PT Start Time 0800    PT Stop Time 0838    PT Time Calculation (min) 38 min    Activity Tolerance Patient tolerated treatment well    Behavior During Therapy [facility] for tasks assessed/performed           Past Medical History:  Diagnosis Date  . GERD (gastroesophageal reflux disease)   . Hypothyroidism   . PONV (postoperative nausea and vomiting)     Past Surgical History:  Procedure Laterality Date  . BREAST LUMPECTOMY    . LAPAROTOMY    . THYROIDECTOMY N/A 01/16/2017   Procedure: THYROIDECTOMY;  Surgeon: Izora Gala, MD;  Location: [facility];  Service: ENT;  Laterality: N/A;  RNFA ([place])  . TONSILLECTOMY    . wisdom teeth      There were no vitals filed for this visit.   Subjective Assessment - 07/23/20 0803    Subjective Pt. reporting she overdid it this weekend with some increased pain after prolonged standing.    How long can you sit comfortably? >1.5 hrs    How long can you stand comfortably? >1.5 hrs    Diagnostic tests Lumbar MRI 05/22/20: 1. At L4-L5 there is a left foraminal/extraforaminal discprotrusion, which results in moderate left foraminal andsubarticular recess stenosis.2. At L3-L4 there is moderate right subarticular recess stenosis,mild canal stenosis, and mild bilateral foraminal stenosis.3. Right eccentric degenerative disc disease at L3-L4.    Patient Stated Goals "no pain"    Currently in Pain? Yes    Pain Score 5     Pain Location Back    Pain  Orientation Left;Lower    Pain Descriptors / Indicators Aching    Pain Type Acute pain    Pain Onset More than a month ago    Pain Frequency Intermittent    Aggravating Factors  prolonged standing    Multiple Pain Sites No              [facility] PT Assessment - 07/23/20 0001      Assessment   Medical Diagnosis Chronic B LBP with B sciatica    Referring Provider (PT) Rodell Perna, MD    Onset Date/Surgical Date 05/03/20    Hand Dominance Right    Next MD Visit none scheduled    Prior Therapy PT for LBP ~11 yrs ago      Observation/Other Assessments   Focus on Therapeutic Outcomes (FOTO)  Lumbar - 65% (35% limitation)      AROM   AROM Assessment Site Lumbar    Lumbar Flexion hands to ankles    Lumbar Extension WNL - no pain    Lumbar - Right Side Bend hand to fibular head    Lumbar - Left Side Bend hand to knee joint line - dull pain on L, stretching on R    Lumbar - Right Rotation WNL    Lumbar - Left Rotation WNL      Strength   Strength  Assessment Site Hip;Knee;Ankle    Right/Left Hip Right;Left    Right Hip Flexion 4+/5    Right Hip Extension 4+/5    Right Hip External Rotation  4/5    Right Hip Internal Rotation 4+/5    Right Hip ABduction 4+/5    Right Hip ADduction 4+/5    Left Hip Flexion 4+/5    Left Hip Extension 4+/5    Left Hip External Rotation 4+/5    Left Hip Internal Rotation 5/5    Left Hip ABduction 4+/5    Left Hip ADduction 4+/5    Right/Left Knee Left;Right    Right Knee Flexion 5/5    Right Knee Extension 5/5    Left Knee Flexion 5/5    Left Knee Extension 5/5    Right Ankle Dorsiflexion 5/5    Left Ankle Dorsiflexion 5/5                         [facility] Adult PT Treatment/Exercise - 07/23/20 0001      Self-Care   Self-Care Other Self-Care Comments    Other Self-Care Comments  HEP review and update      Lumbar Exercises: Stretches   Lumbar Stabilization Level 1 30 seconds;3 reps    Lumbar Stabilization Level 1 Limitations  3-way child's pose/prayer stretch    Standing Side Bend Right;Left;30 seconds;2 reps    Standing Side Bend Limitations QL stretch standing    Other Lumbar Stretch Exercise B glute medius stretch sidelying x 30      Lumbar Exercises: Aerobic   Recumbent Bike L8 x 6 min                  PT Education - 07/23/20 1156    Education Details blue TB isseud to pt. for future HEP progression of bridge and standing side step, monster walk    Person(s) Educated Patient    Methods Explanation;Demonstration;Verbal cues;Handout    Comprehension Verbalized understanding;Returned demonstration;Verbal cues required            PT Short Term Goals - 06/25/20 1705      PT SHORT TERM GOAL #1   Title Patient will be independent with initial HEP    Status Achieved   06/20/20     PT SHORT TERM GOAL #2   Title Patient will verbalize/demonstrate understanding of neutral spine posture and proper body mechanics to reduce strain on lumbar spine    Status Achieved   06/20/20 - Pt has made adjustment to her sleeping and desk positioning with good understanding of neutral spine posture     PT SHORT TERM GOAL #3   Title Patient to report low back pain reduction in frequency and intensity by >/= 25%    Status Achieved   06/25/20 - pt reports 30% reduction in pain & intensity with pain now more centralized            PT Long Term Goals - 07/19/20 0809      PT LONG TERM GOAL #1   Title Patient will be independent with ongoing/advanced HEP    Status Achieved      PT LONG TERM GOAL #2   Title Patient to demonstrate ability to achieve and maintain good spinal alignment/posturing    Status Achieved   07/19/20     PT LONG TERM GOAL #3   Title Patient to report reduction in frequency and intensity of LBP by >/= 50%    Status Achieved  07/19/20 - Pt reports 80-85% reduction in pain     PT LONG TERM GOAL #4   Title Patient to improve lumbar AROM to WFL/WNL without pain provocation    Status  Partially Met   07/19/20 - met except pain with L side bend     PT LONG TERM GOAL #5   Title Patient will demonstrate improved B proximal LE strength to >/= 4 to 4+/5 for improved stability and ease of mobility    Status Achieved   07/19/20     PT LONG TERM GOAL #6   Title Patient to report ability to perform ADLs, household, and work-related tasks without increased pain    Status Partially Met   07/19/20 - still notes increased pain after prolonged sitting or standing at work or when vacuuming for extended periods at home, but otherwise no pain                Plan - 07/23/20 5053    Clinical Impression Statement Pt. has met all STGs in therapy.  Pt. has partially achieved or fully achieved all LTGs in therapy.  Pt. noting increased LBP after prolonged standing and vacuuming however largely pain-free at this point in therapy.  Pt. opting for 30-day hold from therapy and session today focused on HEP review to prepare pt. for home program transition.  Issued pt. blue looped TB for future progression of monster walk and side stepping as she notes green beginning to get easier.  Ended visit with requested Rock Taping application to lumbar spine as pt. notes benefit from this with prolonged standing tolerance.  Pt. verbalizing understanding of ongoing HEP and now on 30-day hold from therapy per supervising PT approval.    Comorbidities Arthritis, thyroidectomy, GERD    Rehab Potential Good    PT Frequency 2x / week    PT Duration 6 weeks    PT Treatment/Interventions ADLs/Self Care Home Management;Cryotherapy;Electrical Stimulation;Iontophoresis 69m/ml Dexamethasone;Moist Heat;Traction;Ultrasound;Functional mobility training;Therapeutic activities;Therapeutic exercise;Neuromuscular re-education;Patient/family education;Manual techniques;Passive range of motion;Dry needling;Taping;Spinal Manipulations    PT Next Visit Plan transition to HEP + 30-day hold    PT Home Exercise Plan 11/8 - HS, ITB,  piriformis & LTR stretches, pelvic tilt; bridge/abd red TB, glute self-massage on wall; 11/15 - quadruped LE raise, clam shell red TB, standing hip abd; 11/24 - counter squat; 12/2 - bird dog, green TB fire hydrants, red TB side-step/monster walk, green TB rows/retraction/pallof press    Consulted and Agree with Plan of Care Patient           Patient will benefit from skilled therapeutic intervention in order to improve the following deficits and impairments:  Decreased activity tolerance,Decreased knowledge of precautions,Decreased mobility,Decreased range of motion,Decreased safety awareness,Decreased strength,Difficulty walking,Increased fascial restricitons,Increased muscle spasms,Impaired perceived functional ability,Impaired flexibility,Improper body mechanics,Postural dysfunction,Pain  Visit Diagnosis: Chronic bilateral low back pain with bilateral sciatica  Muscle spasm of back  Muscle weakness (generalized)     Problem List Patient Active Problem List   Diagnosis Date Noted  . Other intervertebral disc degeneration, lumbar region 05/25/2020  . Lumbar facet arthropathy 03/30/2019  . Headache 09/29/2017  . S/P total thyroidectomy 01/16/2017    MBess Harvest PTA 07/23/20 11:56 AM    [facility] [place] [address]Phone: [phone]  Fax:  [phone] Name: Sydney SHARPLESSMRN: [id number] of Birth: 103-05-1971 PHYSICAL THERAPY DISCHARGE SUMMARY  Visits from Start of Care: 12  Current functional level related to goals / functional outcomes:   Refer to above clinical impression for status as of last visit on 07/23/2020. Patient was placed on hold for 30 days and has not needed to return to PT, therefore will proceed with discharge from PT for this episode.   Remaining deficits:   As above.    Education / Equipment:   HEP  Plan: Patient agrees to discharge.  Patient  goals were partially met. Patient is being discharged due to being pleased with the current functional level.  ?????    Percival Spanish, PT, MPT 08/31/20, 10:40 AM  [facility] [address]  [place], [place], [address] Phone: [phone]   Fax:  [phone]

## 2020-12-12 ENCOUNTER — Other Ambulatory Visit: Payer: Self-pay | Admitting: Interventional Cardiology

## 2021-03-10 ENCOUNTER — Other Ambulatory Visit: Payer: Self-pay | Admitting: Interventional Cardiology

## 2021-03-13 ENCOUNTER — Other Ambulatory Visit: Payer: Self-pay | Admitting: Interventional Cardiology

## 2021-04-10 ENCOUNTER — Other Ambulatory Visit: Payer: Self-pay | Admitting: Interventional Cardiology

## 2021-04-18 ENCOUNTER — Other Ambulatory Visit: Payer: Self-pay | Admitting: Interventional Cardiology

## 2021-05-06 ENCOUNTER — Other Ambulatory Visit: Payer: Self-pay | Admitting: Interventional Cardiology

## 2021-07-13 NOTE — Progress Notes (Signed)
Cardiology Office Note:    Date:  07/15/2021   ID:  Sydney Stephens, DOB 1971/03/28, MRN [id number]  PCP:  Linda Hedges, DO   [facility] Providers Cardiologist:  Sinclair Grooms, MD     Referring MD: Linda Hedges, DO   Chief Complaint: 1 year follow-up of hyperlipidemia  History of Present Illness:    Sydney Stephens is a 50 y.o. female with a hx of total thyroidectomy 2018, GERD, palpitations, hyperlipidemia, and elevated LP(a).   She established care with our group in January 2020 for evaluation of chest pressure and palpitations following an ED visit that revealed negative work-up. She reported having HR as fast as 195 bpm and palpitations. She wore a 30-day cardiac monitor which revealed NSR, sinus tachycardia, no pauses, no SVT, AF or bradycardia. Her echocardiogram on 08/16/18 revealed nl systolic and diastolic function, and no concerning valvular disease. Palpitations were found to be secondary to elevated thyroid levels from Synthroid. During this time, she was started on high-intensity statin for familial hypercholesteremia.  She was last seen on 11/10/19 for chest aching in the central chest. She had a coronary CT which revealed a calcium score of 0, no evidence of CAD, minimal calcification in the descending aorta.   Today, she is here alone for follow-up of her hyperlipidemia. She  denies chest pain, shortness of breath, lower extremity edema, fatigue, palpitations, weakness, presyncope, syncope, orthopnea, and PND. States her palpitations have not returned since her thyroid medication was adjusted more than a year ago and her endocrinologist is closely monitoring her thyroid levels and medication. She is tolerating Crestor 40 mg without complaint. States BP is traditionally well-controlled at < 120/80 mmHg. She has enrolled in  Weight Watchers and is going to start a regular exercise program. Is planning to have a "tummy tuck" soon and needs a recent EKG to share with that  provider.    Past Medical History:  Diagnosis Date   GERD (gastroesophageal reflux disease)    Hypothyroidism    PONV (postoperative nausea and vomiting)     Past Surgical History:  Procedure Laterality Date   BREAST LUMPECTOMY     LAPAROTOMY     THYROIDECTOMY N/A 01/16/2017   Procedure: THYROIDECTOMY;  Surgeon: Izora Gala, MD;  Location: [facility];  Service: ENT;  Laterality: N/A;  RNFA (LINDA)   TONSILLECTOMY     wisdom teeth      Current Medications: Current Meds  Medication Sig   acyclovir (ZOVIRAX) 400 MG tablet acyclovir 400 mg tablet  TAKE 1 TABLET BY MOUTH TWICE A DAY   Cholecalciferol 50 MCG (2000 UT) CAPS Take 1 capsule by mouth daily.   diphenhydrAMINE (BENADRYL) 25 MG tablet Take 25 mg by mouth at bedtime.   docusate sodium (COLACE) 100 MG capsule Take 300 mg by mouth daily.   famotidine (PEPCID) 20 MG tablet Take 20 mg by mouth 2 (two) times daily.   FLUoxetine (PROZAC) 10 MG tablet Take 1 tablet (10 mg total) by mouth daily.   levothyroxine (SYNTHROID, LEVOTHROID) 125 MCG tablet Take 125 mcg by mouth daily.    loratadine (CLARITIN) 10 MG tablet Take 10 mg by mouth daily.   Magnesium Oxide, Antacid, 500 MG CAPS Take 1,000 mg by mouth daily.    [DISCONTINUED] rosuvastatin (CRESTOR) 40 MG tablet TAKE 1 TABLET BY MOUTH DAILY. PLEASE MAKE OVERDUE APPT WITH DR. Tamala Julian BEFORE ANYMORE REFILLS. THANK YOU 3RD AND FINAL ATTEMPT     Allergies:   Levothyroxine sodium and  No known allergies   Social History   Socioeconomic History   Marital status: Married    Spouse name: Not on file   Number of children: Not on file   Years of education: Not on file   Highest education level: Not on file  Occupational History   Not on file  Tobacco Use   Smoking status: Former    Types: Cigarettes    Quit date: 2012    Years since quitting: 10.9   Smokeless tobacco: Never  Vaping Use   Vaping Use: Never used  Substance and Sexual Activity   Alcohol use: Yes    Comment: occ    Drug use: No   Sexual activity: Yes  Other Topics Concern   Not on file  Social History Narrative   Not on file   Social Determinants of Health   Financial Resource Strain: Not on file  Food Insecurity: Not on file  Transportation Needs: Not on file  Physical Activity: Not on file  Stress: Not on file  Social Connections: Not on file     Family History: The patient's family history includes Asthma in her brother; COPD in her mother; Diabetes in her mother; High blood pressure in her mother; Prostate cancer in her father.  ROS:   Please see the history of present illness. All other systems reviewed and are negative.  Labs/Other Studies Reviewed:    The following studies were reviewed today:  Cor CT 5/21  IMPRESSION: 1. Coronary calcium score of 0. This was 0 percentile for age and sex matched control. 2. Normal coronary origin with right dominance. 3. No evidence of CAD.    CTA Chest PE 3/21  IMPRESSION: Normal CTA chest. No evidence of pulmonary embolism. No acute intrathoracic abnormalities.    Echo 1/20  Left ventricle:  Global longitudinal strain is -12.1%.  The cavity size was normal. Wall thickness was normal. Systolic  function was normal. The estimated ejection fraction was in the  range of 55% to 60%. Wall motion was normal; there were no regional  wall motion abnormalities. Left ventricular diastolic function  parameters were normal for the patient&'s age.  Aortic valve:   Structurally normal valve.   Cusp separation was  normal.  Doppler:  Transvalvular velocity was within the normal  range. There was no stenosis. There was no regurgitation.  Aorta: Aortic root: The aortic root was normal in size.  Ascending aorta: The ascending aorta was normal in size.  Mitral valve:   Structurally normal valve.   Leaflet separation was  normal.  Doppler:  Transvalvular velocity was within the normal  range. There was no evidence for stenosis. There was no   regurgitation.    Valve area by pressure half-time: 4.07 cm^2.  Indexed valve area by pressure half-time: 2.04 cm^2/m^2.  Left atrium:  The atrium was normal in size.  Right ventricle:  The cavity size was normal. Systolic function was  normal.  Pulmonic valve:    The valve appears to be grossly normal.  Doppler:  There was no significant regurgitation.  Tricuspid valve:   The valve appears to be grossly normal.  Doppler:  There was trivial regurgitation.  Right atrium:  The atrium was normal in size.  Pericardium: There was no pericardial effusion.   Cardiac Event monitor 09/18/18  NSR Sinus rhythm and sinus tachycardia No pauses, SVT, AF, bradycardia, or pauses    Recent Labs: No results found for requested labs within last 8760 hours.  Recent Lipid  Panel    Component Value Date/Time   CHOL 180 11/08/2019 0838   TRIG 154 (H) 11/08/2019 0838   HDL 53 11/08/2019 0838   CHOLHDL 3.4 11/08/2019 0838   LDLCALC 100 (H) 11/08/2019 9233       Physical Exam:    VS:  BP 118/70   Pulse 79   Ht '5\' 10"'  (1.778 m)   Wt 193 lb 1.6 oz (87.6 kg)   SpO2 95%   BMI 27.71 kg/m     Wt Readings from Last 3 Encounters:  07/15/21 193 lb 1.6 oz (87.6 kg)  05/25/20 171 lb (77.6 kg)  05/03/20 171 lb 12.8 oz (77.9 kg)     GEN:  Well nourished, well developed in no acute distress HEENT: Normal NECK: No JVD; No carotid bruits LYMPHATICS: No lymphadenopathy CARDIAC: RRR, no murmurs, rubs, gallops RESPIRATORY:  Clear to auscultation without rales, wheezing or rhonchi  ABDOMEN: Soft, non-tender, non-distended MUSCULOSKELETAL:  No edema; No deformity  SKIN: Warm and dry NEUROLOGIC:  Alert and oriented x 3 PSYCHIATRIC:  Normal affect   EKG:  EKG is ordered today.  The ekg ordered today demonstrates NSR at rate of 79 bpm, no ST/T wave abnormality  Diagnoses:    1. Familial hypercholesterolemia   2. Palpitations   3. Elevated lipoprotein A level   4. Preop cardiovascular exam     Assessment and Plan:     Elevated lipoprotein A level/Familial hypercholesterolemia:  Last LDL 100, lipoprotein (a) 245 in April 2021. She is tolerating high dose statin without complaints. Will recheck lipid panel and liver function today. Goal LDL < 70. Encouraged weight loss, heart healthy diet, and exercise. She has joined [employer] and plans to start regular exercise program. Advised 150 minutes moderate intensity exercise each week. Would favor adding PCSK9-I if LDL not at goal.  Palpitations: She is not having any palpitations presently. States they have not returned in some time since her thyroid medication was adjusted and has been closely followed by endocrinology.   Preop cardiovascular examination: She is having elective cosmetic surgery and requests an EKG for that provider. EKG demonstrates NSR without ST or T wave abnormality. She reports she will access her record through [facility] and forward to the provider. In the setting of no history of CAD and no angina symptoms, she may proceed with surgery without any further cardiovascular testing. She does not take any blood thinners that need to be held.   According to the Revised Cardiac Risk Index (RCRI), her Perioperative Risk of Major Cardiac Event is (%): 0.4  Her Functional Capacity in METs is: 9.46 according to the [facility] Activity Status Index (DASI).   Disposition: 1 year follow-up with Dr. Tamala Julian or APP     Medication Adjustments/Labs and Tests Ordered: Current medicines are reviewed at length with the patient today.  Concerns regarding medicines are outlined above.  Orders Placed This Encounter  Procedures   Lipid panel   Comp Met (CMET)   EKG 12-Lead    Meds ordered this encounter  Medications   rosuvastatin (CRESTOR) 40 MG tablet    Sig: TAKE 1 TABLET BY MOUTH DAILY.    Dispense:  90 tablet    Refill:  3     Patient Instructions  Medication Instructions:  Your physician recommends that you continue on  your current medications as directed. Please refer to the Current Medication list given to you today.  *If you need a refill on your cardiac medications before your next appointment,  please call your pharmacy*   Lab Work: TODAY:  CMET & LIPID  If you have labs (blood work) drawn today and your tests are completely normal, you will receive your results only by: [place] (if you have MyChart) OR A paper copy in the mail If you have any lab test that is abnormal or we need to change your treatment, we will call you to review the results.   Testing/Procedures: None ordered   Follow-Up: At [facility], you and your health needs are our priority.  As part of our continuing mission to provide you with exceptional heart care, we have created designated Provider Care Teams.  These Care Teams include your primary Cardiologist (physician) and Advanced Practice Providers (APPs -  Physician Assistants and Nurse Practitioners) who all work together to provide you with the care you need, when you need it.  We recommend signing up for the patient portal called "MyChart".  Sign up information is provided on this After Visit Summary.  MyChart is used to connect with patients for Virtual Visits (Telemedicine).  Patients are able to view lab/test results, encounter notes, upcoming appointments, etc.  Non-urgent messages can be sent to your provider as well.   To learn more about what you can do with MyChart, go to [email or web address].    Your next appointment:   12 month(s)  The format for your next appointment:   In Person  Provider:   Sinclair Grooms, MD  or Christen Bame, NP         Other Instructions    Signed, Emmaline Life, NP  07/15/2021 9:02 AM    [place]

## 2021-07-15 ENCOUNTER — Ambulatory Visit (INDEPENDENT_AMBULATORY_CARE_PROVIDER_SITE_OTHER): Payer: BC Managed Care – PPO | Admitting: Nurse Practitioner

## 2021-07-15 ENCOUNTER — Encounter: Payer: Self-pay | Admitting: Nurse Practitioner

## 2021-07-15 ENCOUNTER — Other Ambulatory Visit: Payer: Self-pay

## 2021-07-15 VITALS — BP 118/70 | HR 79 | Ht 70.0 in | Wt 193.1 lb

## 2021-07-15 DIAGNOSIS — R002 Palpitations: Secondary | ICD-10-CM | POA: Diagnosis not present

## 2021-07-15 DIAGNOSIS — Z01810 Encounter for preprocedural cardiovascular examination: Secondary | ICD-10-CM | POA: Diagnosis not present

## 2021-07-15 DIAGNOSIS — E7801 Familial hypercholesterolemia: Secondary | ICD-10-CM

## 2021-07-15 DIAGNOSIS — E7841 Elevated Lipoprotein(a): Secondary | ICD-10-CM | POA: Diagnosis not present

## 2021-07-15 LAB — COMPREHENSIVE METABOLIC PANEL
ALT: 10 IU/L (ref 0–32)
AST: 16 IU/L (ref 0–40)
Albumin/Globulin Ratio: 1.6 (ref 1.2–2.2)
Albumin: 4.4 g/dL (ref 3.8–4.9)
Alkaline Phosphatase: 126 IU/L — ABNORMAL HIGH (ref 44–121)
BUN/Creatinine Ratio: 22 (ref 9–23)
BUN: 16 mg/dL (ref 6–24)
Bilirubin Total: 0.2 mg/dL (ref 0.0–1.2)
CO2: 25 mmol/L (ref 20–29)
Calcium: 9.1 mg/dL (ref 8.7–10.2)
Chloride: 101 mmol/L (ref 96–106)
Creatinine, Ser: 0.74 mg/dL (ref 0.57–1.00)
Globulin, Total: 2.7 g/dL (ref 1.5–4.5)
Glucose: 94 mg/dL (ref 70–99)
Potassium: 4.3 mmol/L (ref 3.5–5.2)
Sodium: 139 mmol/L (ref 134–144)
Total Protein: 7.1 g/dL (ref 6.0–8.5)
eGFR: 98 mL/min/{1.73_m2} (ref >59)

## 2021-07-15 LAB — LIPID PANEL
Chol/HDL Ratio: 4.2 ratio (ref 0.0–4.4)
Cholesterol, Total: 204 mg/dL — ABNORMAL HIGH (ref 100–199)
HDL: 49 mg/dL (ref >39)
LDL Chol Calc (NIH): 125 mg/dL — ABNORMAL HIGH (ref 0–99)
Triglycerides: 172 mg/dL — ABNORMAL HIGH (ref 0–149)
VLDL Cholesterol Cal: 30 mg/dL (ref 5–40)

## 2021-07-15 MED ORDER — ROSUVASTATIN CALCIUM 40 MG PO TABS: ORAL_TABLET | ORAL | 3 refills | Status: DC

## 2021-07-15 NOTE — Patient Instructions (Signed)
Medication Instructions:  Your physician recommends that you continue on your current medications as directed. Please refer to the Current Medication list given to you today.  *If you need a refill on your cardiac medications before your next appointment, please call your pharmacy*   Lab Work: TODAY:  CMET & LIPID  If you have labs (blood work) drawn today and your tests are completely normal, you will receive your results only by: [place] (if you have MyChart) OR A paper copy in the mail If you have any lab test that is abnormal or we need to change your treatment, we will call you to review the results.   Testing/Procedures: None ordered   Follow-Up: At [facility], you and your health needs are our priority.  As part of our continuing mission to provide you with exceptional heart care, we have created designated Provider Care Teams.  These Care Teams include your primary Cardiologist (physician) and Advanced Practice Providers (APPs -  Physician Assistants and Nurse Practitioners) who all work together to provide you with the care you need, when you need it.  We recommend signing up for the patient portal called "MyChart".  Sign up information is provided on this After Visit Summary.  MyChart is used to connect with patients for Virtual Visits (Telemedicine).  Patients are able to view lab/test results, encounter notes, upcoming appointments, etc.  Non-urgent messages can be sent to your provider as well.   To learn more about what you can do with MyChart, go to [email or web address].    Your next appointment:   12 month(s)  The format for your next appointment:   In Person  Provider:   Sinclair Grooms, MD  or Christen Bame, NP         Other Instructions

## 2021-07-16 DIAGNOSIS — E785 Hyperlipidemia, unspecified: Secondary | ICD-10-CM

## 2021-07-16 MED ORDER — EZETIMIBE 10 MG PO TABS: 10.0000 mg | ORAL_TABLET | Freq: Every day | ORAL | 3 refills | Status: DC

## 2021-07-22 ENCOUNTER — Encounter (HOSPITAL_BASED_OUTPATIENT_CLINIC_OR_DEPARTMENT_OTHER): Payer: Self-pay | Admitting: Emergency Medicine

## 2021-07-22 ENCOUNTER — Other Ambulatory Visit: Payer: Self-pay

## 2021-07-22 ENCOUNTER — Emergency Department (HOSPITAL_BASED_OUTPATIENT_CLINIC_OR_DEPARTMENT_OTHER)
Admission: EM | Admit: 2021-07-22 | Discharge: 2021-07-22 | Disposition: A | Discharge status: Home / Self Care | Payer: BC Managed Care – PPO | Attending: Emergency Medicine | Admitting: Emergency Medicine

## 2021-07-22 ENCOUNTER — Emergency Department (HOSPITAL_BASED_OUTPATIENT_CLINIC_OR_DEPARTMENT_OTHER): Payer: BC Managed Care – PPO

## 2021-07-22 DIAGNOSIS — Z79899 Other long term (current) drug therapy: Secondary | ICD-10-CM | POA: Insufficient documentation

## 2021-07-22 DIAGNOSIS — R109 Unspecified abdominal pain: Secondary | ICD-10-CM | POA: Insufficient documentation

## 2021-07-22 DIAGNOSIS — E039 Hypothyroidism, unspecified: Secondary | ICD-10-CM | POA: Insufficient documentation

## 2021-07-22 DIAGNOSIS — R0781 Pleurodynia: Secondary | ICD-10-CM | POA: Insufficient documentation

## 2021-07-22 DIAGNOSIS — R0782 Intercostal pain: Secondary | ICD-10-CM | POA: Diagnosis not present

## 2021-07-22 DIAGNOSIS — Z87891 Personal history of nicotine dependence: Secondary | ICD-10-CM | POA: Diagnosis not present

## 2021-07-22 NOTE — ED Notes (Signed)
No acute distress noted upon this RN's departure of patient. Verified discharge paperwork with name and DOB. Vital signs stable. Patient taken to checkout window. Discharge paperwork discussed with patient. No further questions voiced upon discharge.

## 2021-07-22 NOTE — Discharge Instructions (Signed)
Imaging of your ribs today was unremarkable for fractures or other abnormalities.  Please manage your symptoms with over-the-counter ibuprofen and Tylenol as needed for pain.  Make sure that you are still taking a deep breath as shallow breathing can increase your risk of getting pneumonia.  You may also ice the areas that are painful.  Follow-up with your primary care if symptoms persist.  Return if development of any new or worsening symptoms.

## 2021-07-22 NOTE — ED Provider Notes (Signed)
[facility] EMERGENCY DEPARTMENT Provider Note   CSN: [id number] Arrival date & time: 07/22/21  1513     History Chief Complaint  Patient presents with   Flank Pain    Sydney Stephens is a 50 y.o. female.  Patient with history of GERD presents today with chief complaint of right-sided rib pain.  She states that her symptoms started on Saturday after she had a friend crack her back.  She states that she laid down on the floor with a pillow under her stomach and had her friend apply pressure to the middle of her spine.  She states that her did pop, however she significant pain to the right side of her rib cage that radiated to the front and into her sternum.  She states that her symptoms have been worsening over the past 2 days with painful inspiration and tenderness over the area.  She states "I can feel my ribs rubbing together."  She denies any fevers, chills, shortness of breath, nausea, or vomiting.  No changes in sensation or numbness/tingling in her extremities.  The history is provided by the patient. No language interpreter was used.  Flank Pain Pertinent negatives include no chest pain, no headaches and no shortness of breath.      Past Medical History:  Diagnosis Date   GERD (gastroesophageal reflux disease)    Hypothyroidism    PONV (postoperative nausea and vomiting)     Patient Active Problem List   Diagnosis Date Noted   Other intervertebral disc degeneration, lumbar region 05/25/2020   Lumbar facet arthropathy 03/30/2019   Headache 09/29/2017   S/P total thyroidectomy 01/16/2017    Past Surgical History:  Procedure Laterality Date   BREAST LUMPECTOMY     LAPAROTOMY     THYROIDECTOMY N/A 01/16/2017   Procedure: THYROIDECTOMY;  Surgeon: Izora Gala, MD;  Location: [facility];  Service: ENT;  Laterality: N/A;  RNFA (LINDA)   TONSILLECTOMY     wisdom teeth       OB History   No obstetric history on file.     Family History  Problem Relation Age of  Onset   Diabetes Mother    COPD Mother    High blood pressure Mother    Prostate cancer Father    Asthma Brother     Social History   Tobacco Use   Smoking status: Former    Types: Cigarettes    Quit date: 2012    Years since quitting: 10.9   Smokeless tobacco: Never  Vaping Use   Vaping Use: Never used  Substance Use Topics   Alcohol use: Yes    Comment: occ   Drug use: No    Home Medications Prior to Admission medications   Medication Sig Start Date End Date Taking? Authorizing Provider  acyclovir (ZOVIRAX) 400 MG tablet acyclovir 400 mg tablet  TAKE 1 TABLET BY MOUTH TWICE A DAY    [provider]  Cholecalciferol 50 MCG (2000 UT) CAPS Take 1 capsule by mouth daily.    [provider]  diphenhydrAMINE (BENADRYL) 25 MG tablet Take 25 mg by mouth at bedtime.    [provider]  docusate sodium (COLACE) 100 MG capsule Take 300 mg by mouth daily.    [provider]  ezetimibe (ZETIA) 10 MG tablet Take 1 tablet (10 mg total) by mouth daily. 07/16/21 10/14/21  Swinyer, Lanice Schwab, NP  famotidine (PEPCID) 20 MG tablet Take 20 mg by mouth 2 (two) times daily.  [provider]  FLUoxetine (PROZAC) 10 MG tablet Take 1 tablet (10 mg total) by mouth daily. 04/18/19   Corena Herter, PA-C  levothyroxine (SYNTHROID, LEVOTHROID) 125 MCG tablet Take 125 mcg by mouth daily.  11/26/16   [provider]  loratadine (CLARITIN) 10 MG tablet Take 10 mg by mouth daily.    [provider]  Magnesium Oxide, Antacid, 500 MG CAPS Take 1,000 mg by mouth daily.     [provider]  rosuvastatin (CRESTOR) 40 MG tablet TAKE 1 TABLET BY MOUTH DAILY. 07/15/21   Swinyer, Lanice Schwab, NP    Allergies    Levothyroxine sodium and No known allergies  Review of Systems   Review of Systems  Constitutional:  Negative for chills and fever.  Respiratory:  Negative for apnea, cough, choking, chest tightness, shortness of breath, wheezing  and stridor.   Cardiovascular:  Negative for chest pain, palpitations and leg swelling.  Gastrointestinal:  Negative for diarrhea, nausea and vomiting.  Genitourinary:  Positive for flank pain.  Musculoskeletal:  Negative for back pain, gait problem, joint swelling, neck pain and neck stiffness.       Right sided rib pain  Skin:  Negative for rash.  Neurological:  Negative for dizziness, tremors, seizures, syncope, facial asymmetry, speech difficulty, weakness, light-headedness, numbness and headaches.  Psychiatric/Behavioral:  Negative for confusion and decreased concentration.   All other systems reviewed and are negative.  Physical Exam Updated Vital Signs BP 126/69 (BP Location: Right Arm)    Pulse 79    Temp 98.2 F (36.8 C) (Oral)    Resp 20    Ht 5\' 10"  (1.778 m)    Wt 86.2 kg    SpO2 100%    BMI 27.26 kg/m   Physical Exam Vitals and nursing note reviewed.  Constitutional:      General: She is not in acute distress.    Appearance: Normal appearance. She is normal weight. She is not ill-appearing, toxic-appearing or diaphoretic.     Comments: Patient sitting comfortably in chair in no distress  HENT:     Head: Normocephalic and atraumatic.  Eyes:     Extraocular Movements: Extraocular movements intact.  Cardiovascular:     Rate and Rhythm: Normal rate and regular rhythm.     Heart sounds: Normal heart sounds.  Pulmonary:     Effort: Pulmonary effort is normal. No respiratory distress.     Breath sounds: Normal breath sounds. No stridor. No wheezing, rhonchi or rales.     Comments: Tenderness noted along the ribcage of the right lower ribs around rib 8 or 9 on the right side into the front all along the same rib.  No bruising or deformity noted. Chest:     Chest wall: Tenderness present.  Abdominal:     General: Abdomen is flat.     Palpations: Abdomen is soft.  Musculoskeletal:        General: Normal range of motion.     Cervical back: Normal range of motion and neck  supple.  Skin:    General: Skin is warm and dry.  Neurological:     General: No focal deficit present.     Mental Status: She is alert.  Psychiatric:        Mood and Affect: Mood normal.        Behavior: Behavior normal.    ED Results / Procedures / Treatments   Labs (all labs ordered are listed, but only abnormal results are displayed) Labs  Reviewed - No data to display  EKG None  Radiology DG Ribs Unilateral W/Chest Right  Result Date: 07/22/2021 CLINICAL DATA:  Rib pain.  Right axillary pain. EXAM: RIGHT RIBS AND CHEST - 3+ VIEW COMPARISON:  None. FINDINGS: No fracture or other bone lesions are seen involving the ribs. There is no evidence of pneumothorax or pleural effusion. Both lungs are clear. Heart size and mediastinal contours are within normal limits. IMPRESSION: Negative. Electronically Signed   By: Ronney Asters M.D.   On: 07/22/2021 15:49    Procedures Procedures   Medications Ordered in ED Medications - No data to display  ED Course  I have reviewed the triage vital signs and the nursing notes.  Pertinent labs & imaging results that were available during my care of the patient were reviewed by me and considered in my medical decision making (see chart for details).    MDM Rules/Calculators/A&P                         Patient presents with right-sided rib pain since Saturday when she had her back popped by a friend. Pain was immediate and has persisted since with pain on deep inspiration. He is tender to palpation along the 8th or 9th rib on the right side. Lungs present and clear to auscultation in all fields. No bruising or deformity present.  Rib x-ray unremarkable for acute abnormality, fractures or dislocation. No concerns for ACS, pneumothorax, or otherwise. No neuro deficits or sharp shooting pain or numbness/tingling. No midline tenderness along thoracic spine. Discussed with patient management with supportive care and to ensure that she is taking deep  breaths despite pain. Will recommend PCP follow-up if symptoms persist.    Final Clinical Impression(s) / ED Diagnoses Final diagnoses:  Rib pain on right side    Rx / DC Orders ED Discharge Orders     None     An After Visit Summary was printed and given to the patient.    Nestor Lewandowsky 07/22/21 2030    Drenda Freeze, MD 07/26/21 [phone]

## 2021-07-22 NOTE — ED Triage Notes (Signed)
States had someone crack her back on  Sat night and now  pt is having rt rib pain , felt a sharp pain and feels like it is rubbing badly, hurts to move and take a deep breath

## 2021-09-16 ENCOUNTER — Other Ambulatory Visit: Payer: BC Managed Care – PPO | Admitting: *Deleted

## 2021-09-16 ENCOUNTER — Other Ambulatory Visit: Payer: Self-pay

## 2021-09-16 DIAGNOSIS — E785 Hyperlipidemia, unspecified: Secondary | ICD-10-CM | POA: Diagnosis not present

## 2021-09-16 LAB — LIPID PANEL
Chol/HDL Ratio: 3.2 ratio (ref 0.0–4.4)
Cholesterol, Total: 152 mg/dL (ref 100–199)
HDL: 47 mg/dL (ref >39)
LDL Chol Calc (NIH): 79 mg/dL (ref 0–99)
Triglycerides: 151 mg/dL — ABNORMAL HIGH (ref 0–149)
VLDL Cholesterol Cal: 26 mg/dL (ref 5–40)

## 2021-09-16 LAB — ALT: ALT: 9 IU/L (ref 0–32)

## 2021-09-23 ENCOUNTER — Other Ambulatory Visit: Payer: BC Managed Care – PPO

## 2021-10-22 DIAGNOSIS — H18513 Endothelial corneal dystrophy, bilateral: Secondary | ICD-10-CM | POA: Diagnosis not present

## 2022-01-03 DIAGNOSIS — J3800 Paralysis of vocal cords and larynx, unspecified: Secondary | ICD-10-CM | POA: Diagnosis not present

## 2022-01-03 DIAGNOSIS — E890 Postprocedural hypothyroidism: Secondary | ICD-10-CM | POA: Diagnosis not present

## 2022-01-06 ENCOUNTER — Ambulatory Visit: Payer: BC Managed Care – PPO | Admitting: Family Medicine

## 2022-04-15 DIAGNOSIS — H18513 Endothelial corneal dystrophy, bilateral: Secondary | ICD-10-CM | POA: Diagnosis not present

## 2022-07-03 ENCOUNTER — Other Ambulatory Visit: Payer: Self-pay | Admitting: Nurse Practitioner

## 2022-07-17 ENCOUNTER — Other Ambulatory Visit: Payer: Self-pay | Admitting: Nurse Practitioner

## 2022-08-09 ENCOUNTER — Other Ambulatory Visit: Payer: Self-pay | Admitting: Interventional Cardiology

## 2022-08-12 ENCOUNTER — Other Ambulatory Visit: Payer: Self-pay | Admitting: Interventional Cardiology

## 2022-08-27 ENCOUNTER — Other Ambulatory Visit: Payer: Self-pay | Admitting: Interventional Cardiology

## 2022-09-23 ENCOUNTER — Other Ambulatory Visit: Payer: Self-pay | Admitting: Nurse Practitioner

## 2022-09-29 ENCOUNTER — Other Ambulatory Visit: Payer: Self-pay

## 2022-09-29 MED ORDER — EZETIMIBE 10 MG PO TABS: 10.0000 mg | ORAL_TABLET | Freq: Every day | ORAL | 0 refills | Status: DC

## 2022-10-30 ENCOUNTER — Other Ambulatory Visit: Payer: Self-pay | Admitting: Nurse Practitioner

## 2022-11-05 ENCOUNTER — Other Ambulatory Visit: Payer: Self-pay

## 2022-11-05 ENCOUNTER — Telehealth: Payer: Self-pay

## 2022-11-05 DIAGNOSIS — E785 Hyperlipidemia, unspecified: Secondary | ICD-10-CM

## 2022-11-05 MED ORDER — ROSUVASTATIN CALCIUM 40 MG PO TABS: ORAL_TABLET | ORAL | 0 refills | Status: AC

## 2022-11-05 MED ORDER — EZETIMIBE 10 MG PO TABS: 10.0000 mg | ORAL_TABLET | Freq: Every day | ORAL | 0 refills | Status: AC

## 2022-11-05 NOTE — Telephone Encounter (Signed)
Patient recently moved to [place], [place]. Already has an appointment scheduled to establish with new cardiologist there (Dr. Dwyane Luo) but patient needs refills until that appointment on 01/27/23. Crestor 40mg  and Zetia 10mg  prescriptions sent to [facility] in [place].

## 2023-02-02 ENCOUNTER — Other Ambulatory Visit: Payer: Self-pay | Admitting: Nurse Practitioner

## 2023-02-02 DIAGNOSIS — E785 Hyperlipidemia, unspecified: Secondary | ICD-10-CM
